# Patient Record
Sex: Female | Born: 1970 | Race: Black or African American | Hispanic: No | Marital: Married | Smoking: Never smoker
Health system: Southern US, Community
[De-identification: ages and names within clinical notes are randomized; demographics above are authoritative.]

## PROBLEM LIST (undated history)

## (undated) DIAGNOSIS — G43909 Migraine, unspecified, not intractable, without status migrainosus: Secondary | ICD-10-CM

## (undated) DIAGNOSIS — F4010 Social phobia, unspecified: Secondary | ICD-10-CM

## (undated) DIAGNOSIS — T7840XA Allergy, unspecified, initial encounter: Secondary | ICD-10-CM

## (undated) DIAGNOSIS — E079 Disorder of thyroid, unspecified: Secondary | ICD-10-CM

## (undated) DIAGNOSIS — Z8619 Personal history of other infectious and parasitic diseases: Secondary | ICD-10-CM

## (undated) DIAGNOSIS — E785 Hyperlipidemia, unspecified: Secondary | ICD-10-CM

## (undated) HISTORY — DX: Social phobia, unspecified: F40.10

## (undated) HISTORY — DX: Personal history of other infectious and parasitic diseases: Z86.19

## (undated) HISTORY — DX: Allergy, unspecified, initial encounter: T78.40XA

## (undated) HISTORY — DX: Hyperlipidemia, unspecified: E78.5

## (undated) HISTORY — DX: Disorder of thyroid, unspecified: E07.9

## (undated) HISTORY — DX: Migraine, unspecified, not intractable, without status migrainosus: G43.909

---

## 2005-07-13 HISTORY — PX: ABDOMINAL HYSTERECTOMY: SHX81

## 2005-09-29 DIAGNOSIS — F4010 Social phobia, unspecified: Secondary | ICD-10-CM | POA: Insufficient documentation

## 2009-07-11 DIAGNOSIS — E785 Hyperlipidemia, unspecified: Secondary | ICD-10-CM | POA: Insufficient documentation

## 2009-08-23 ENCOUNTER — Ambulatory Visit: Payer: Self-pay | Admitting: Family Medicine

## 2012-11-14 ENCOUNTER — Ambulatory Visit: Payer: Self-pay | Admitting: Family Medicine

## 2013-06-14 ENCOUNTER — Ambulatory Visit: Payer: Self-pay | Admitting: Family Medicine

## 2014-04-12 ENCOUNTER — Ambulatory Visit: Payer: Self-pay | Admitting: Surgery

## 2014-04-12 LAB — BASIC METABOLIC PANEL
ANION GAP: 5 — AB (ref 7–16)
BUN: 10 mg/dL (ref 7–18)
CHLORIDE: 107 mmol/L (ref 98–107)
CREATININE: 0.89 mg/dL (ref 0.60–1.30)
Calcium, Total: 8.4 mg/dL — ABNORMAL LOW (ref 8.5–10.1)
Co2: 28 mmol/L (ref 21–32)
Glucose: 77 mg/dL (ref 65–99)
OSMOLALITY: 277 (ref 275–301)
Potassium: 3.9 mmol/L (ref 3.5–5.1)
SODIUM: 140 mmol/L (ref 136–145)

## 2014-04-12 LAB — HEPATIC FUNCTION PANEL A (ARMC)
ALBUMIN: 3.7 g/dL (ref 3.4–5.0)
ALK PHOS: 53 U/L
Bilirubin,Total: 0.2 mg/dL (ref 0.2–1.0)
SGOT(AST): 18 U/L (ref 15–37)
SGPT (ALT): 22 U/L
Total Protein: 7.3 g/dL (ref 6.4–8.2)

## 2014-04-12 LAB — CBC WITH DIFFERENTIAL/PLATELET
Basophil #: 0 10*3/uL (ref 0.0–0.1)
Basophil %: 0.6 %
Eosinophil #: 0.1 10*3/uL (ref 0.0–0.7)
Eosinophil %: 1.3 %
HCT: 40.3 % (ref 35.0–47.0)
HGB: 12.2 g/dL (ref 12.0–16.0)
Lymphocyte #: 2.7 10*3/uL (ref 1.0–3.6)
Lymphocyte %: 34.4 %
MCH: 20.9 pg — ABNORMAL LOW (ref 26.0–34.0)
MCHC: 30.2 g/dL — ABNORMAL LOW (ref 32.0–36.0)
MCV: 69 fL — ABNORMAL LOW (ref 80–100)
Monocyte #: 0.8 x10 3/mm (ref 0.2–0.9)
Monocyte %: 9.5 %
NEUTROS ABS: 4.3 10*3/uL (ref 1.4–6.5)
Neutrophil %: 54.2 %
PLATELETS: 379 10*3/uL (ref 150–440)
RBC: 5.81 10*6/uL — AB (ref 3.80–5.20)
RDW: 15.1 % — AB (ref 11.5–14.5)
WBC: 8 10*3/uL (ref 3.6–11.0)

## 2014-04-12 LAB — APTT: Activated PTT: 36.3 secs — ABNORMAL HIGH (ref 23.6–35.9)

## 2014-04-12 LAB — PROTIME-INR
INR: 1
Prothrombin Time: 13 secs (ref 11.5–14.7)

## 2014-04-20 ENCOUNTER — Ambulatory Visit: Payer: Self-pay | Admitting: Surgery

## 2014-04-20 HISTORY — PX: THYROIDECTOMY: SHX17

## 2014-04-21 LAB — CALCIUM: Calcium, Total: 7.9 mg/dL — ABNORMAL LOW (ref 8.5–10.1)

## 2014-04-21 LAB — ALBUMIN: Albumin: 3.4 g/dL (ref 3.4–5.0)

## 2014-04-27 LAB — PATHOLOGY REPORT

## 2014-05-25 ENCOUNTER — Ambulatory Visit: Payer: Self-pay | Admitting: Family Medicine

## 2014-08-15 LAB — LIPID PANEL
Cholesterol: 216 mg/dL — AB (ref 0–200)
HDL: 48 mg/dL (ref 35–70)
LDL Cholesterol: 131 mg/dL
TRIGLYCERIDES: 187 mg/dL — AB (ref 40–160)

## 2014-08-15 LAB — TSH: TSH: 0.45 u[IU]/mL (ref 0.41–5.90)

## 2014-11-03 NOTE — Op Note (Signed)
PATIENT NAME:  Laura Wilkinson, Corayma L MR#:  [id number] DATE OF BIRTH:  12-16-1970  DATE OF PROCEDURE:  04/20/2014  PREOPERATIVE DIAGNOSIS: Multinodular goiter.   POSTOPERATIVE DIAGNOSIS: Multinodular goiter.   OPERATION PERFORMED: Total thyroidectomy.   SURGEON: Claude MangesWilliam F. Atianna Haidar, MD   FIRST ASSISTANT: Hulda Marinimothy Oaks, MD   ANESTHESIA: General.   PROCEDURE IN DETAIL: The patient was placed supine on the operating room table and prepped and draped in the usual sterile fashion. A curvilinear incision was made in the lines of the neck above the suprasternal notch and this was carried down through the subcutaneous tissue and platysma muscle with the electrocautery. Subplatysmal flaps were created superiorly and inferiorly and the intermediate fascia of the neck was opened in the midline between the strap muscles and the strap muscles were dissected off of the anterior aspects of the thyroid bilaterally. On each side, the thyroid lobe was rotated medially and the middle thyroid vein was ligated and divided and the superior thyroid vessels were ligated and divided with the Harmonic scalpel. On the left, a 2-0 silk ligature was also placed on the superior pole vessels. The inferior pole vessels were individually ligated and divided with the Harmonic scalpel and as the thyroid was rotated medially the recurrent laryngeal nerve was identified and spared harm throughout the procedure by avoiding use of energy devices near the nerve. This was accomplished with medium and small hemoclips and the bipolar electrocautery where necessary. The ligaments of Allyson SabalBerry were divided, and attachments to the trachea were divided, and the pyramidal lobe was dissected off of the anterior aspect of the trachea, and the thyroid was removed. Hemostasis was achieved with a combination of the electrocautery, the bipolar electrocautery, the Harmonic scalpel and medium and small hemoclips. No parathyroid glands were removed during the  procedure, and the recurrent laryngeal nerve on each side was identified and spared. The wound was irrigated with warm normal saline, this was suctioned clear, and a small TLS drain was brought through the suprasternal notch and placed on both sides of the neck, and the intermediate fascia was closed with a running 3-0 Monocryl suture. The platysma was closed with a running 3-0 Monocryl suture and the skin was reapproximated with a running subcuticular 5-0 Monocryl and suture strips. The patient tolerated the procedure well. There were no complications.   ____________________________ Claude MangesWilliam F. Jancarlo Biermann, MD wfm:sb D: 04/20/2014 10:42:32 ET T: 04/20/2014 11:14:48 ET JOB#: [id number]  cc: Claude MangesWilliam F. Clothilde Tippetts, MD, <Dictator> A. Wendall MolaMelissa Solum, MD Claude MangesWILLIAM F Wilene Pharo MD ELECTRONICALLY SIGNED 04/20/2014 18:13

## 2015-10-23 DIAGNOSIS — J309 Allergic rhinitis, unspecified: Secondary | ICD-10-CM | POA: Insufficient documentation

## 2015-10-23 DIAGNOSIS — M549 Dorsalgia, unspecified: Secondary | ICD-10-CM | POA: Insufficient documentation

## 2015-10-23 DIAGNOSIS — R253 Fasciculation: Secondary | ICD-10-CM | POA: Insufficient documentation

## 2015-10-23 DIAGNOSIS — E890 Postprocedural hypothyroidism: Secondary | ICD-10-CM | POA: Insufficient documentation

## 2015-10-23 DIAGNOSIS — R9389 Abnormal findings on diagnostic imaging of other specified body structures: Secondary | ICD-10-CM | POA: Insufficient documentation

## 2015-10-23 DIAGNOSIS — F338 Other recurrent depressive disorders: Secondary | ICD-10-CM | POA: Insufficient documentation

## 2015-10-23 DIAGNOSIS — G43909 Migraine, unspecified, not intractable, without status migrainosus: Secondary | ICD-10-CM | POA: Insufficient documentation

## 2015-10-30 ENCOUNTER — Ambulatory Visit (INDEPENDENT_AMBULATORY_CARE_PROVIDER_SITE_OTHER): Payer: BC Managed Care – PPO | Admitting: Family Medicine

## 2015-10-30 ENCOUNTER — Encounter: Payer: Self-pay | Admitting: Family Medicine

## 2015-10-30 VITALS — BP 122/80 | HR 59 | Temp 98.3°F | Resp 16 | Ht 65.0 in | Wt 221.0 lb

## 2015-10-30 DIAGNOSIS — E890 Postprocedural hypothyroidism: Secondary | ICD-10-CM | POA: Diagnosis not present

## 2015-10-30 DIAGNOSIS — E785 Hyperlipidemia, unspecified: Secondary | ICD-10-CM

## 2015-10-30 DIAGNOSIS — R109 Unspecified abdominal pain: Secondary | ICD-10-CM | POA: Diagnosis not present

## 2015-10-30 DIAGNOSIS — E669 Obesity, unspecified: Secondary | ICD-10-CM

## 2015-10-30 DIAGNOSIS — R12 Heartburn: Secondary | ICD-10-CM

## 2015-10-30 NOTE — Progress Notes (Addendum)
Patient: Laura Wilkinson Female    DOB: [birth year]   44 y.o.   MRN: [id number] Visit Date: 10/30/2015  Today's Provider: Mila Merry, MD   Chief Complaint  Patient presents with  . Follow-up  . Hyperlipidemia  . Hypothyroidism   Subjective:    HPI   Follow-up for hypothyroidism from 08/15/2014; no changes were made.     Lipid/Cholesterol, Follow-up:   Last seen for this 14  months ago.  Management since that visit includes; no changes.  Last Lipid Panel:    Component Value Date/Time   CHOL 216* 08/15/2014   TRIG 187* 08/15/2014   HDL 48 08/15/2014   LDLCALC 131 08/15/2014    She reports good compliance with treatment. She is not having side effects. none  Wt Readings from Last 3 Encounters:  10/30/15 221 lb (100.245 kg)  08/15/14 218 lb (98.884 kg)    ----------------------------------------------------------------------  Abdominal pain for 1 year. Lower abdomin, above pelvic area. Pain is intermittent. No urinary symptoms or change in bowel movements.Pain is intermittent, but occurs a couple of times every night. Often notices when she turns in her bed, sometimes occurs in the morning. No pains during the day and no pain after eating. No bowel changes.   Has been having more heartburn the last few months. Occurs 2- 3 times a week depending on diet.    Allergies  Allergen Reactions  . Phentermine Hcl     Other reaction(s): Headache Blurry Vision   Previous Medications   LEVOTHYROXINE (SYNTHROID, LEVOTHROID) 112 MCG TABLET    Take 1 tablet by mouth daily.   LOVASTATIN (MEVACOR) 10 MG TABLET    Take 1 tablet by mouth daily.   MULTIPLE VITAMINS-MINERALS (MULTIVITAMIN ADULT PO)    Take 1 tablet by mouth daily.   OMEGA-3 FATTY ACIDS PO    Take 2 capsules by mouth daily.    Review of Systems  Constitutional: Negative for fever, chills, appetite change and fatigue.  Respiratory: Negative for chest tightness and shortness of breath.     Cardiovascular: Negative for chest pain and palpitations.  Gastrointestinal: Positive for abdominal distention. Negative for nausea, vomiting and abdominal pain.       Indigestion   Neurological: Negative for dizziness and weakness.    Social History  Substance Use Topics  . Smoking status: Never Smoker   . Smokeless tobacco: Not on file  . Alcohol Use: No   Objective:   BP 122/80 mmHg  Pulse 59  Temp(Src) 98.3 F (36.8 C) (Oral)  Resp 16  Ht  (1.651 m)  Wt 221 lb (100.245 kg)  BMI 36.78 kg/m2  SpO2 98%  Physical Exam  General Appearance:    Alert, cooperative, no distress, obese  Eyes:    PERRL, conjunctiva/corneas clear, EOM's intact       Lungs:     Clear to auscultation bilaterally, respirations unlabored  Heart:    Regular rate and rhythm  Neurologic:   Awake, alert, oriented x 3. No apparent focal neurological           defect.   Abdomen:   Obese, mild tenderness across abdomen below umbilicus. No masses, no rebound, no guarding.        Assessment & Plan:     1. Hypothyroidism, postop  - T4 AND TSH  2. HLD (hyperlipidemia) She is tolerating lovastatin well with no adverse effects.   - Lipid panel  3. Obesity   4. Abdominal pain, unspecified  abdominal location Seems to be more positional, but is occuring fairly frequently  Labs and see how ultrasound looks.  - CBC - Comprehensive metabolic panel - US Abdomen Complete; Future  5. Heartburn Discussed several OTC antacids and acid reducing medications she can take prn, and dietary modifications.        Mila Merryonald Fisher, MD  [facility]

## 2015-10-31 LAB — LIPID PANEL
CHOLESTEROL TOTAL: 240 mg/dL — AB (ref 100–199)
Chol/HDL Ratio: 5 ratio units — ABNORMAL HIGH (ref 0.0–4.4)
HDL: 48 mg/dL (ref >39)
LDL CALC: 155 mg/dL — AB (ref 0–99)
TRIGLYCERIDES: 184 mg/dL — AB (ref 0–149)
VLDL CHOLESTEROL CAL: 37 mg/dL (ref 5–40)

## 2015-10-31 LAB — T4 AND TSH
T4 TOTAL: 9.7 ug/dL (ref 4.5–12.0)
TSH: 10.33 u[IU]/mL — ABNORMAL HIGH (ref 0.450–4.500)

## 2015-10-31 LAB — COMPREHENSIVE METABOLIC PANEL
ALBUMIN: 4.5 g/dL (ref 3.5–5.5)
ALK PHOS: 49 IU/L (ref 39–117)
ALT: 14 IU/L (ref 0–32)
AST: 9 IU/L (ref 0–40)
Albumin/Globulin Ratio: 1.7 (ref 1.2–2.2)
BILIRUBIN TOTAL: 0.3 mg/dL (ref 0.0–1.2)
BUN / CREAT RATIO: 7 — AB (ref 9–23)
BUN: 7 mg/dL (ref 6–24)
CHLORIDE: 101 mmol/L (ref 96–106)
CO2: 21 mmol/L (ref 18–29)
CREATININE: 0.95 mg/dL (ref 0.57–1.00)
Calcium: 9.4 mg/dL (ref 8.7–10.2)
GFR calc Af Amer: 84 mL/min/{1.73_m2} (ref >59)
GFR calc non Af Amer: 73 mL/min/{1.73_m2} (ref >59)
GLUCOSE: 91 mg/dL (ref 65–99)
Globulin, Total: 2.6 g/dL (ref 1.5–4.5)
Potassium: 5.3 mmol/L — ABNORMAL HIGH (ref 3.5–5.2)
Sodium: 141 mmol/L (ref 134–144)
Total Protein: 7.1 g/dL (ref 6.0–8.5)

## 2015-10-31 LAB — CBC
HEMATOCRIT: 40.6 % (ref 34.0–46.6)
Hemoglobin: 13.1 g/dL (ref 11.1–15.9)
MCH: 21.6 pg — AB (ref 26.6–33.0)
MCHC: 32.3 g/dL (ref 31.5–35.7)
MCV: 67 fL — AB (ref 79–97)
PLATELETS: 427 10*3/uL — AB (ref 150–379)
RBC: 6.07 x10E6/uL — ABNORMAL HIGH (ref 3.77–5.28)
RDW: 16.6 % — AB (ref 12.3–15.4)
WBC: 7.6 10*3/uL (ref 3.4–10.8)

## 2015-10-31 NOTE — Addendum Note (Signed)
Addended by: Malva LimesFISHER, Anmarie Fukushima E on: 10/31/2015 08:11 AM   Modules accepted: Kipp BroodSmartSet

## 2015-11-01 ENCOUNTER — Other Ambulatory Visit: Payer: Self-pay | Admitting: Emergency Medicine

## 2015-11-01 MED ORDER — LEVOTHYROXINE SODIUM 125 MCG PO TABS: 125.0000 ug | ORAL_TABLET | Freq: Every day | ORAL | Status: DC

## 2015-11-05 ENCOUNTER — Ambulatory Visit: Payer: BC Managed Care – PPO

## 2015-11-12 ENCOUNTER — Encounter: Payer: Self-pay | Admitting: Family Medicine

## 2015-12-27 ENCOUNTER — Ambulatory Visit
Admission: RE | Admit: 2015-12-27 | Discharge: 2015-12-27 | Disposition: A | Discharge status: Home / Self Care | Payer: BC Managed Care – PPO | Source: Ambulatory Visit | Attending: Family Medicine | Admitting: Family Medicine

## 2015-12-27 DIAGNOSIS — R109 Unspecified abdominal pain: Secondary | ICD-10-CM | POA: Insufficient documentation

## 2016-01-24 ENCOUNTER — Encounter: Payer: Self-pay | Admitting: Family Medicine

## 2016-01-24 ENCOUNTER — Ambulatory Visit (INDEPENDENT_AMBULATORY_CARE_PROVIDER_SITE_OTHER): Payer: BC Managed Care – PPO | Admitting: Family Medicine

## 2016-01-24 VITALS — BP 130/88 | HR 64 | Temp 98.3°F | Resp 16 | Wt 222.0 lb

## 2016-01-24 DIAGNOSIS — E785 Hyperlipidemia, unspecified: Secondary | ICD-10-CM | POA: Diagnosis not present

## 2016-01-24 DIAGNOSIS — E890 Postprocedural hypothyroidism: Secondary | ICD-10-CM | POA: Diagnosis not present

## 2016-01-24 NOTE — Progress Notes (Signed)
Patient: Laura Wilkinson Female    DOB: 09/02/1970   45 y.o.   MRN: [id number] Visit Date: 01/24/2016  Today's Provider: Mila Merryonald Dorn Hartshorne, MD   Chief Complaint  Patient presents with  . Hypothyroidism    follow up   . Hyperlipidemia    follow up   Subjective:    HPI  Lipid/Cholesterol, Follow-up:   Last seen for this 3 months ago.  Management changes since that visit include advising patient top cut back on saturated fats and sugars and exercise 150 minutes weekly. . Last Lipid Panel:    Component Value Date/Time   CHOL 240* 10/30/2015 0842   CHOL 216* 08/15/2014   TRIG 184* 10/30/2015 0842   HDL 48 10/30/2015 0842   HDL 48 08/15/2014   CHOLHDL 5.0* 10/30/2015 0842   LDLCALC 155* 10/30/2015 0842   LDLCALC 131 08/15/2014    Risk factors for vascular disease include hypercholesterolemia  She reports good compliance with treatment. She is not having side effects.  Current symptoms include none and have been stable. Weight trend: fluctuating  Prior visit with dietician: no Current diet: in general, a "healthy" diet   Current exercise: cardiovascular workout on exercise equipment, walking and Insanity workout  Wt Readings from Last 3 Encounters:  10/30/15 221 lb (100.245 kg)  08/15/14 218 lb (98.884 kg)    ------------------------------------------------------------------- Follow up Hypothyroidism: Patient was last seen 3 months ago. Changes made during that visit includes increasing Levothyroxine from 112mcg to 125mcg daily. Patient reports good compliance with treatment and fair tolerance. Since changing the dose of Levothyroxine, patient has had heart palpitations,and fatigue. She also reports 20 minutes after taking the Levothyroxine, she has to have a bowel movement. Is exercising every day and eating more vegetable.s     Allergies  Allergen Reactions  . Phentermine Hcl     Other reaction(s): Headache Blurry Vision   Current Meds  Medication Sig  .  Calcium Carb-Cholecalciferol (CALCIUM 500+D PO) Take 2 tablets by mouth 2 (two) times daily.  Marland Kitchen. levothyroxine (SYNTHROID, LEVOTHROID) 125 MCG tablet Take 1 tablet (125 mcg total) by mouth daily.  Marland Kitchen. lovastatin (MEVACOR) 10 MG tablet Take 1 tablet by mouth daily.  . Multiple Vitamins-Minerals (MULTIVITAMIN ADULT PO) Take 1 tablet by mouth daily.  . OMEGA-3 FATTY ACIDS PO Take 2 capsules by mouth daily.    Review of Systems  Constitutional: Positive for chills (in the evenings before going to bed), fatigue and unexpected weight change (weight fluctuating). Negative for fever and appetite change.  HENT: Positive for ear pain (right ear).   Respiratory: Negative for chest tightness and shortness of breath.   Cardiovascular: Positive for palpitations. Negative for chest pain.  Gastrointestinal: Negative for nausea, vomiting and abdominal pain.  Endocrine: Positive for cold intolerance.  Neurological: Negative for dizziness and weakness.    Social History  Substance Use Topics  . Smoking status: Never Smoker   . Smokeless tobacco: Not on file  . Alcohol Use: No   Objective:   BP 130/88 mmHg  Pulse 64  Temp(Src) 98.3 F (36.8 C) (Oral)  Resp 16  Wt 222 lb (100.699 kg)  Physical Exam  General Appearance:    Alert, cooperative, no distress, obese  Eyes:    PERRL, conjunctiva/corneas clear, EOM's intact       Lungs:     Clear to auscultation bilaterally, respirations unlabored  Heart:    Regular rate and rhythm  Neurologic:   Awake, alert, oriented x 3.  No apparent focal neurological           defect.           Assessment & Plan:     1. HLD (hyperlipidemia) Doing well with diet and exercise and tolerating low dose of lovastatin - Lipid panel  2. Hypothyroidism, postop Some minor bowel changes since increase dose of levothyroxine. See how levels look.  - T4 AND TSH     The entirety of the information documented in the History of Present Illness, Review of Systems and Physical  Exam were personally obtained by me. Portions of this information were initially documented by Awilda Bill, CMA and reviewed by me for thoroughness and accuracy.    Mila Merry, MD  [facility]

## 2016-01-25 LAB — LIPID PANEL
CHOL/HDL RATIO: 4.4 ratio (ref 0.0–4.4)
Cholesterol, Total: 197 mg/dL (ref 100–199)
HDL: 45 mg/dL (ref >39)
LDL Calculated: 124 mg/dL — ABNORMAL HIGH (ref 0–99)
Triglycerides: 140 mg/dL (ref 0–149)
VLDL CHOLESTEROL CAL: 28 mg/dL (ref 5–40)

## 2016-01-25 LAB — T4 AND TSH
T4 TOTAL: 9.4 ug/dL (ref 4.5–12.0)
TSH: 1.33 u[IU]/mL (ref 0.450–4.500)

## 2016-01-27 ENCOUNTER — Telehealth: Payer: Self-pay

## 2016-01-27 NOTE — Telephone Encounter (Signed)
Pt is returning call.  CB#[phone]/MW

## 2016-01-27 NOTE — Telephone Encounter (Signed)
LMTCB 01/27/2016  Thanks,   -Laura  

## 2016-01-27 NOTE — Telephone Encounter (Signed)
Patient advised and verbally voiced understanding.  

## 2016-01-27 NOTE — Telephone Encounter (Signed)
-----   Message from Malva Limesonald E Fisher, MD sent at 01/26/2016  8:53 PM EDT ----- Cholesterol is much better, down from 240 to 197. Thyroid functions are good. Continue current medications.  Check labs yearly.

## 2016-02-07 ENCOUNTER — Encounter: Payer: Self-pay | Admitting: Family Medicine

## 2016-02-12 ENCOUNTER — Encounter: Payer: Self-pay | Admitting: Family Medicine

## 2016-02-19 ENCOUNTER — Other Ambulatory Visit: Payer: Self-pay | Admitting: Family Medicine

## 2016-03-11 ENCOUNTER — Ambulatory Visit
Admission: RE | Admit: 2016-03-11 | Discharge: 2016-03-11 | Disposition: A | Discharge status: Home / Self Care | Payer: BC Managed Care – PPO | Source: Ambulatory Visit | Attending: Otolaryngology | Admitting: Otolaryngology

## 2016-03-11 ENCOUNTER — Other Ambulatory Visit: Payer: Self-pay | Admitting: Otolaryngology

## 2016-03-11 DIAGNOSIS — R51 Headache: Secondary | ICD-10-CM | POA: Diagnosis present

## 2016-03-11 DIAGNOSIS — R519 Headache, unspecified: Secondary | ICD-10-CM

## 2016-03-24 ENCOUNTER — Other Ambulatory Visit: Payer: Self-pay | Admitting: Family Medicine

## 2016-05-05 ENCOUNTER — Other Ambulatory Visit: Payer: Self-pay | Admitting: *Deleted

## 2016-05-05 MED ORDER — LEVOTHYROXINE SODIUM 125 MCG PO TABS: 125.0000 ug | ORAL_TABLET | Freq: Every day | ORAL | 3 refills | Status: DC

## 2016-05-05 NOTE — Telephone Encounter (Signed)
Requesting 90 day supply.

## 2017-02-16 ENCOUNTER — Encounter: Payer: Self-pay | Admitting: Family Medicine

## 2017-02-16 ENCOUNTER — Ambulatory Visit (INDEPENDENT_AMBULATORY_CARE_PROVIDER_SITE_OTHER): Payer: BLUE CROSS/BLUE SHIELD | Admitting: Family Medicine

## 2017-02-16 VITALS — BP 132/80 | HR 80 | Temp 97.8°F | Resp 16 | Ht 65.0 in | Wt 221.0 lb

## 2017-02-16 DIAGNOSIS — E785 Hyperlipidemia, unspecified: Secondary | ICD-10-CM | POA: Diagnosis not present

## 2017-02-16 DIAGNOSIS — Z0000 Encounter for general adult medical examination without abnormal findings: Secondary | ICD-10-CM

## 2017-02-16 DIAGNOSIS — R238 Other skin changes: Secondary | ICD-10-CM | POA: Diagnosis not present

## 2017-02-16 DIAGNOSIS — F339 Major depressive disorder, recurrent, unspecified: Secondary | ICD-10-CM

## 2017-02-16 DIAGNOSIS — Z90710 Acquired absence of both cervix and uterus: Secondary | ICD-10-CM | POA: Diagnosis not present

## 2017-02-16 DIAGNOSIS — R233 Spontaneous ecchymoses: Secondary | ICD-10-CM

## 2017-02-16 DIAGNOSIS — F338 Other recurrent depressive disorders: Secondary | ICD-10-CM

## 2017-02-16 DIAGNOSIS — R252 Cramp and spasm: Secondary | ICD-10-CM | POA: Diagnosis not present

## 2017-02-16 DIAGNOSIS — Z6835 Body mass index (BMI) 35.0-35.9, adult: Secondary | ICD-10-CM | POA: Diagnosis not present

## 2017-02-16 DIAGNOSIS — E890 Postprocedural hypothyroidism: Secondary | ICD-10-CM

## 2017-02-16 NOTE — Progress Notes (Signed)
Patient: Laura Wilkinson, Female    DOB: [birth year], 46 y.o.   MRN: [id number] Visit Date: 02/16/2017  Today's Provider: Mila Merry, MD   Chief Complaint  Patient presents with  . Annual Exam  . Hyperlipidemia  . Hypothyroidism  . muscle cramping  . bruising easily   Subjective:    Annual physical exam Laura Wilkinson is a 46 y.o. female who presents today for health maintenance and complete physical. She feels well. She reports exercising daily. She reports she is sleeping well.  Mammogram- 11/14/2012. Normal. Tdap- 03/11/2012 Pap- 03/11/2012. Normal. Also had a partial hysterectomy in 2007.     Lipid/Cholesterol, Follow-up:   Last seen for this1 years ago.  Management changes since that visit include no changes. . Last Lipid Panel:    Component Value Date/Time   CHOL 197 01/24/2016 0856   TRIG 140 01/24/2016 0856   HDL 45 01/24/2016 0856   CHOLHDL 4.4 01/24/2016 0856   LDLCALC 124 (H) 01/24/2016 0856     She reports good compliance with treatment. She is not having side effects.  Current symptoms include none and have been stable. Weight trend: stable Prior visit with dietician: no Current diet: well balanced Current exercise: running/ jogging and walking  Wt Readings from Last 3 Encounters:  02/16/17 221 lb (100.2 kg)  01/24/16 222 lb (100.7 kg)  10/30/15 221 lb (100.2 kg)     Hypothyroidism, follow up: Patient was last seen in the office 1 year ago. No changes were made in her medications. She is currently taking levothyroxine daily, and reports good symptom control.   Muscle cramps: Patient reports that she is having muscle cramps in her left calf muscle. She denies any injuries. She says that this comes and goes. Her symptoms seem to be worse when she runs or jogs. She also mentions that she stands for long periods of time at work, and this may contribute to her symptoms.   Bruising: Patient reports that she has several unexplained  bruises all throughout her body. She doesn't take an aspirin or any blood thinners. She denies having any injuries.   Review of Systems  Constitutional: Negative.   HENT: Negative.   Eyes: Negative.   Respiratory: Negative.   Cardiovascular: Negative.   Gastrointestinal: Negative.   Endocrine: Negative.   Genitourinary: Negative.   Musculoskeletal: Negative.   Skin: Negative.   Allergic/Immunologic: Positive for environmental allergies.  Neurological: Negative.   Hematological: Bruises/bleeds easily.  Psychiatric/Behavioral: Negative.     Social History      She  reports that she has never smoked. She has never used smokeless tobacco. She reports that she does not drink alcohol or use drugs.       Social History   Social History  . Marital status: Married    Spouse name: N/A  . Number of children: N/A  . Years of education: N/A   Occupational History  .      [employer] associate   Social History Main Topics  . Smoking status: Never Smoker  . Smokeless tobacco: Never Used  . Alcohol use No  . Drug use: No  . Sexual activity: Not Asked   Other Topics Concern  . None   Social History Narrative  . None    Past Medical History:  Diagnosis Date  . Allergy   . History of chicken pox   . Hyperlipidemia   . Migraine   . Social phobia   . Thyroid  disease      Patient Active Problem List   Diagnosis Date Noted  . S/P abdominal hysterectomy 02/16/2017  . Abnormal finding on radiology exam 10/23/2015  . Allergic rhinitis 10/23/2015  . Back pain 10/23/2015  . Fasciculation 10/23/2015  . Migraine headache 10/23/2015  . Hypothyroidism, postop 10/23/2015  . Seasonal affective disorder (HCC) 10/23/2015  . HLD (hyperlipidemia) 07/11/2009  . Social phobia 09/29/2005  . Obesity 07/13/1998    Past Surgical History:  Procedure Laterality Date  . 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 THYROIDECTOMY  04/20/2014    Total. Done by Dr. Anda Kraft    Family History        Family Status  Relation Status  . Mother Alive  . Father Alive  . Sister Alive  . Neg Hx (Not Specified)        Her family history includes Hyperlipidemia in her father and mother; Hypertension in her father and mother.     Allergies  Allergen Reactions  . Phentermine Hcl     Other reaction(s): Headache Blurry Vision     Current Outpatient Prescriptions:  .  Calcium Carb-Cholecalciferol (CALCIUM 500+D PO), Take 2 tablets by mouth 2 (two) times daily., Disp: , Rfl:  .  levothyroxine (SYNTHROID, LEVOTHROID) 125 MCG tablet, Take 1 tablet (125 mcg total) by mouth daily., Disp: 90 tablet, Rfl: 3 .  lovastatin (MEVACOR) 10 MG tablet, TAKE ONE TABLET BY MOUTH ONCE DAILY, Disp: 90 tablet, Rfl: 3 .  Multiple Vitamins-Minerals (MULTIVITAMIN ADULT PO), Take 1 tablet by mouth daily., Disp: , Rfl:  .  OMEGA-3 FATTY ACIDS PO, Take 2 capsules by mouth daily., Disp: , Rfl:    Patient Care Team: Malva Limes, MD as PCP - General (Family Medicine) [facility] (Gynecology)      Objective:   Vitals: BP 132/80 (BP Location: Left Arm, Patient Position: Sitting, Cuff Size: Large)   Pulse 80   Temp 97.8 F (36.6 C)   Resp 16   Ht 5\' 5"  (1.651 m)   Wt 221 lb (100.2 kg)   BMI 36.78 kg/m    Vitals:   02/16/17 0909  BP: 132/80  Pulse: 80  Resp: 16  Temp: 97.8 F (36.6 C)  Weight: 221 lb (100.2 kg)  Height: 5\' 5"  (1.651 m)     Physical Exam  Constitutional: She is oriented to person, place, and time. She appears well-developed and well-nourished.  Overweight  HENT:  Head: Normocephalic and atraumatic.  Right Ear: External ear normal.  Left Ear: External ear normal.  Nose: Nose normal.  Mouth/Throat: Oropharynx is clear and moist.  Eyes: Pupils are equal, round, and reactive to light. Conjunctivae and EOM are normal.  Neck: Normal range of motion. Neck supple.  Cardiovascular: Normal rate, regular rhythm, normal  heart sounds and intact distal pulses.   Pulmonary/Chest: Effort normal and breath sounds normal. Right breast exhibits no inverted nipple, no mass, no nipple discharge, no skin change and no tenderness. Left breast exhibits no inverted nipple, no mass, no nipple discharge, no skin change and no tenderness. Breasts are symmetrical.  Abdominal: Soft. Bowel sounds are normal.  Musculoskeletal: Normal range of motion. She exhibits no edema or tenderness.  Neurological: She is alert and oriented to person, place, and time.  Skin: Skin is warm and dry. No erythema.  Psychiatric: She has a normal mood and affect. Her behavior is normal. Judgment and thought content normal.  Nursing note and vitals reviewed.  Depression Screen PHQ 2/9 Scores 02/16/2017  PHQ - 2 Score 0  PHQ- 9 Score 0      Assessment & Plan:     Routine Health Maintenance and Physical Exam  Exercise Activities and Dietary recommendations Goals    None      Immunization History  Administered Date(s) Administered  . Tdap 03/11/2012    Health Maintenance  Topic Date Due  . HIV Screening  06/21/1985  . INFLUENZA VACCINE  02/10/2017  . TETANUS/TDAP  03/11/2022     Discussed health benefits of physical activity, and encouraged her to engage in regular exercise appropriate for her age and condition.    1. Annual physical exam  - Comprehensive metabolic panel - Lipid panel - T4 AND TSH - CBC  2. Hypothyroidism, postop  - T4 AND TSH  3. Hyperlipidemia, unspecified hyperlipidemia type She is tolerating lovastatin well with no adverse effects.   - Comprehensive metabolic panel - Lipid panel  4. S/P abdominal hysterectomy   5. Seasonal affective disorder (HCC)   6. Abnormal bruising  - CBC - PT and PTT  7. Muscle cramps Try OTC B-complex - Magnesium  8. BMI 35.0-35.9,adult Diet and exercise    Mila Merryonald Cherron Blitzer, MD  [facility]

## 2017-02-16 NOTE — Patient Instructions (Signed)
Try taking OTC B Complex vitamin which often helps with muscle cramps.     Preventive Care 40-64 Years, Female Preventive care refers to lifestyle choices and visits with your health care provider that can promote health and wellness. What does preventive care include?  A yearly physical exam. This is also called an annual well check.  Dental exams once or twice a year.  Routine eye exams. Ask your health care provider how often you should have your eyes checked.  Personal lifestyle choices, including: ? Daily care of your teeth and gums. ? Regular physical activity. ? Eating a healthy diet. ? Avoiding tobacco and drug use. ? Limiting alcohol use. ? Practicing safe sex. ? Taking low-dose aspirin daily starting at age 56. ? Taking vitamin and mineral supplements as recommended by your health care provider. What happens during an annual well check? The services and screenings done by your health care provider during your annual well check will depend on your age, overall health, lifestyle risk factors, and family history of disease. Counseling Your health care provider may ask you questions about your:  Alcohol use.  Tobacco use.  Drug use.  Emotional well-being.  Home and relationship well-being.  Sexual activity.  Eating habits.  Work and work Statistician.  Method of birth control.  Menstrual cycle.  Pregnancy history.  Screening You may have the following tests or measurements:  Height, weight, and BMI.  Blood pressure.  Lipid and cholesterol levels. These may be checked every 5 years, or more frequently if you are over 27 years old.  Skin check.  Lung cancer screening. You may have this screening every year starting at age 35 if you have a 30-pack-year history of smoking and currently smoke or have quit within the past 15 years.  Fecal occult blood test (FOBT) of the stool. You may have this test every year starting at age 56.  Flexible sigmoidoscopy  or colonoscopy. You may have a sigmoidoscopy every 5 years or a colonoscopy every 10 years starting at age 55.  Hepatitis C blood test.  Hepatitis B blood test.  Sexually transmitted disease (STD) testing.  Diabetes screening. This is done by checking your blood sugar (glucose) after you have not eaten for a while (fasting). You may have this done every 1-3 years.  Mammogram. This may be done every 1-2 years. Talk to your health care provider about when you should start having regular mammograms. This may depend on whether you have a family history of breast cancer.  BRCA-related cancer screening. This may be done if you have a family history of breast, ovarian, tubal, or peritoneal cancers.  Pelvic exam and Pap test. This may be done every 3 years starting at age 76. Starting at age 37, this may be done every 5 years if you have a Pap test in combination with an HPV test.  Bone density scan. This is done to screen for osteoporosis. You may have this scan if you are at high risk for osteoporosis.  Discuss your test results, treatment options, and if necessary, the need for more tests with your health care provider. Vaccines Your health care provider may recommend certain vaccines, such as:  Influenza vaccine. This is recommended every year.  Tetanus, diphtheria, and acellular pertussis (Tdap, Td) vaccine. You may need a Td booster every 10 years.  Varicella vaccine. You may need this if you have not been vaccinated.  Zoster vaccine. You may need this after age 71.  Measles, mumps, and rubella (  MMR) vaccine. You may need at least one dose of MMR if you were born in 1957 or later. You may also need a second dose.  Pneumococcal 13-valent conjugate (PCV13) vaccine. You may need this if you have certain conditions and were not previously vaccinated.  Pneumococcal polysaccharide (PPSV23) vaccine. You may need one or two doses if you smoke cigarettes or if you have certain  conditions.  Meningococcal vaccine. You may need this if you have certain conditions.  Hepatitis A vaccine. You may need this if you have certain conditions or if you travel or work in places where you may be exposed to hepatitis A.  Hepatitis B vaccine. You may need this if you have certain conditions or if you travel or work in places where you may be exposed to hepatitis B.  Haemophilus influenzae type b (Hib) vaccine. You may need this if you have certain conditions.  Talk to your health care provider about which screenings and vaccines you need and how often you need them. This information is not intended to replace advice given to you by your health care provider. Make sure you discuss any questions you have with your health care provider. Document Released: 07/26/2015 Document Revised: 03/18/2016 Document Reviewed: 04/30/2015 Elsevier Interactive Patient Education  2017 [employer].

## 2017-02-17 LAB — COMPREHENSIVE METABOLIC PANEL
A/G RATIO: 1.7 (ref 1.2–2.2)
ALBUMIN: 4.3 g/dL (ref 3.5–5.5)
ALT: 16 IU/L (ref 0–32)
AST: 16 IU/L (ref 0–40)
Alkaline Phosphatase: 48 IU/L (ref 39–117)
BILIRUBIN TOTAL: 0.4 mg/dL (ref 0.0–1.2)
BUN / CREAT RATIO: 17 (ref 9–23)
BUN: 11 mg/dL (ref 6–24)
CALCIUM: 9.3 mg/dL (ref 8.7–10.2)
CO2: 21 mmol/L (ref 20–29)
Chloride: 103 mmol/L (ref 96–106)
Creatinine, Ser: 0.64 mg/dL (ref 0.57–1.00)
GFR, EST AFRICAN AMERICAN: 124 mL/min/{1.73_m2} (ref >59)
GFR, EST NON AFRICAN AMERICAN: 107 mL/min/{1.73_m2} (ref >59)
GLOBULIN, TOTAL: 2.6 g/dL (ref 1.5–4.5)
Glucose: 87 mg/dL (ref 65–99)
POTASSIUM: 4.8 mmol/L (ref 3.5–5.2)
SODIUM: 140 mmol/L (ref 134–144)
TOTAL PROTEIN: 6.9 g/dL (ref 6.0–8.5)

## 2017-02-17 LAB — T4 AND TSH
T4 TOTAL: 8.8 ug/dL (ref 4.5–12.0)
TSH: 1.05 u[IU]/mL (ref 0.450–4.500)

## 2017-02-17 LAB — LIPID PANEL
CHOL/HDL RATIO: 3.9 ratio (ref 0.0–4.4)
Cholesterol, Total: 197 mg/dL (ref 100–199)
HDL: 50 mg/dL (ref >39)
LDL Calculated: 122 mg/dL — ABNORMAL HIGH (ref 0–99)
Triglycerides: 126 mg/dL (ref 0–149)
VLDL Cholesterol Cal: 25 mg/dL (ref 5–40)

## 2017-02-17 LAB — CBC
Hematocrit: 43.4 % (ref 34.0–46.6)
Hemoglobin: 13.2 g/dL (ref 11.1–15.9)
MCH: 21.1 pg — AB (ref 26.6–33.0)
MCHC: 30.4 g/dL — ABNORMAL LOW (ref 31.5–35.7)
MCV: 69 fL — ABNORMAL LOW (ref 79–97)
PLATELETS: 386 10*3/uL — AB (ref 150–379)
RBC: 6.26 x10E6/uL — AB (ref 3.77–5.28)
RDW: 16.8 % — ABNORMAL HIGH (ref 12.3–15.4)
WBC: 6.2 10*3/uL (ref 3.4–10.8)

## 2017-02-17 LAB — MAGNESIUM: MAGNESIUM: 2.1 mg/dL (ref 1.6–2.3)

## 2017-02-17 LAB — PT AND PTT
INR: 1 (ref 0.8–1.2)
PROTHROMBIN TIME: 10.3 s (ref 9.1–12.0)
aPTT: 31 s (ref 24–33)

## 2017-03-04 ENCOUNTER — Other Ambulatory Visit: Payer: Self-pay | Admitting: Family Medicine

## 2017-04-29 ENCOUNTER — Other Ambulatory Visit: Payer: Self-pay | Admitting: Family Medicine

## 2017-04-29 DIAGNOSIS — Z1231 Encounter for screening mammogram for malignant neoplasm of breast: Secondary | ICD-10-CM

## 2017-04-30 ENCOUNTER — Ambulatory Visit
Admission: RE | Admit: 2017-04-30 | Discharge: 2017-04-30 | Disposition: A | Discharge status: Home / Self Care | Payer: BLUE CROSS/BLUE SHIELD | Source: Ambulatory Visit | Attending: Family Medicine | Admitting: Family Medicine

## 2017-04-30 DIAGNOSIS — Z1231 Encounter for screening mammogram for malignant neoplasm of breast: Secondary | ICD-10-CM | POA: Diagnosis present

## 2017-05-05 ENCOUNTER — Other Ambulatory Visit: Payer: Self-pay | Admitting: Family Medicine

## 2017-05-05 NOTE — Telephone Encounter (Signed)
Pharmacy requesting refills. Thanks!  

## 2017-06-29 ENCOUNTER — Telehealth: Payer: Self-pay | Admitting: Family Medicine

## 2017-06-29 NOTE — Telephone Encounter (Signed)
Pt needs a copy of her immunization records if we have them on file  Her call back is [phone]  Thanks teri

## 2017-06-30 NOTE — Telephone Encounter (Signed)
LMTCB, we have Tdap in epic but [facility] has 2 accounts with her DOB but different last names, need to find out if she had a different last name before to see if there is another record in [facility] Sonnie [facility], RMA

## 2017-08-19 DIAGNOSIS — H524 Presbyopia: Secondary | ICD-10-CM | POA: Diagnosis not present

## 2018-02-17 ENCOUNTER — Encounter: Payer: Self-pay | Admitting: Family Medicine

## 2018-02-17 ENCOUNTER — Ambulatory Visit (INDEPENDENT_AMBULATORY_CARE_PROVIDER_SITE_OTHER): Payer: 59 | Admitting: Family Medicine

## 2018-02-17 VITALS — BP 143/92 | HR 57 | Temp 98.3°F | Resp 16 | Ht 65.0 in | Wt 220.0 lb

## 2018-02-17 DIAGNOSIS — Z0000 Encounter for general adult medical examination without abnormal findings: Secondary | ICD-10-CM | POA: Diagnosis not present

## 2018-02-17 DIAGNOSIS — E890 Postprocedural hypothyroidism: Secondary | ICD-10-CM | POA: Diagnosis not present

## 2018-02-17 DIAGNOSIS — E785 Hyperlipidemia, unspecified: Secondary | ICD-10-CM

## 2018-02-17 DIAGNOSIS — R718 Other abnormality of red blood cells: Secondary | ICD-10-CM | POA: Diagnosis not present

## 2018-02-17 DIAGNOSIS — J301 Allergic rhinitis due to pollen: Secondary | ICD-10-CM

## 2018-02-17 DIAGNOSIS — R030 Elevated blood-pressure reading, without diagnosis of hypertension: Secondary | ICD-10-CM | POA: Diagnosis not present

## 2018-02-17 NOTE — Patient Instructions (Signed)
You may want to consider getting a hepatitis A vaccine. Hepatitis A is a foodborne virus that occasionally infects the liver and can cause liver failure. Please contact our office if you interested in getting this vaccine   [facility] the [facility] [phone]) to schedule a routine screening mammogram.   DASH Eating Plan DASH stands for "Dietary Approaches to Stop Hypertension." The DASH eating plan is a healthy eating plan that has been shown to reduce high blood pressure (hypertension). It may also reduce your risk for type 2 diabetes, heart disease, and stroke. The DASH eating plan may also help with weight loss. What are tips for following this plan? General guidelines  Avoid eating more than 2,300 mg (milligrams) of salt (sodium) a day. If you have hypertension, you may need to reduce your sodium intake to 1,500 mg a day.  Limit alcohol intake to no more than 1 drink a day for nonpregnant women and 2 drinks a day for men. One drink equals 12 oz of beer, 5 oz of wine, or 1 oz of hard liquor.  Work with your health care provider to maintain a healthy body weight or to lose weight. Ask what an ideal weight is for you.  Get at least 30 minutes of exercise that causes your heart to beat faster (aerobic exercise) most days of the week. Activities may include walking, swimming, or biking.  Work with your health care provider or diet and nutrition specialist (dietitian) to adjust your eating plan to your individual calorie needs. Reading food labels  Check food labels for the amount of sodium per serving. Choose foods with less than 5 percent of the Daily Value of sodium. Generally, foods with less than 300 mg of sodium per serving fit into this eating plan.  To find whole grains, look for the word "whole" as the first word in the ingredient list. Shopping  Buy products labeled as "low-sodium" or "no salt added."  Buy fresh foods. Avoid canned foods and premade or frozen  meals. Cooking  Avoid adding salt when cooking. Use salt-free seasonings or herbs instead of table salt or sea salt. Check with your health care provider or pharmacist before using salt substitutes.  Do not fry foods. Cook foods using healthy methods such as baking, boiling, grilling, and broiling instead.  Cook with heart-healthy oils, such as olive, canola, soybean, or sunflower oil. Meal planning   Eat a balanced diet that includes: ? 5 or more servings of fruits and vegetables each day. At each meal, try to fill half of your plate with fruits and vegetables. ? Up to 6-8 servings of whole grains each day. ? Less than 6 oz of lean meat, poultry, or fish each day. A 3-oz serving of meat is about the same size as a deck of cards. One egg equals 1 oz. ? 2 servings of low-fat dairy each day. ? A serving of nuts, seeds, or beans 5 times each week. ? Heart-healthy fats. Healthy fats called Omega-3 fatty acids are found in foods such as flaxseeds and coldwater fish, like sardines, salmon, and mackerel.  Limit how much you eat of the following: ? Canned or prepackaged foods. ? Food that is high in trans fat, such as fried foods. ? Food that is high in saturated fat, such as fatty meat. ? Sweets, desserts, sugary drinks, and other foods with added sugar. ? Full-fat dairy products.  Do not salt foods before eating.  Try to eat at least  2 vegetarian meals each week.  Eat more home-cooked food and less restaurant, buffet, and fast food.  When eating at a restaurant, ask that your food be prepared with less salt or no salt, if possible. What foods are recommended? The items listed may not be a complete list. Talk with your dietitian about what dietary choices are best for you. Grains Whole-grain or whole-wheat bread. Whole-grain or whole-wheat pasta. Brown rice. [place] Morrow. Bulgur. Whole-grain and low-sodium cereals. Pita bread. Low-fat, low-sodium crackers. Whole-wheat flour  tortillas. Vegetables Fresh or frozen vegetables (raw, steamed, roasted, or grilled). Low-sodium or reduced-sodium tomato and vegetable juice. Low-sodium or reduced-sodium tomato sauce and tomato paste. Low-sodium or reduced-sodium canned vegetables. Fruits All fresh, dried, or frozen fruit. Canned fruit in natural juice (without added sugar). Meat and other protein foods Skinless chicken or [place]. Ground chicken or [place]. Pork with fat trimmed off. Fish and seafood. Egg whites. Dried beans, peas, or lentils. Unsalted nuts, nut butters, and seeds. Unsalted canned beans. Lean cuts of beef with fat trimmed off. Low-sodium, lean deli meat. Dairy Low-fat (1%) or fat-free (skim) milk. Fat-free, low-fat, or reduced-fat cheeses. Nonfat, low-sodium ricotta or cottage cheese. Low-fat or nonfat yogurt. Low-fat, low-sodium cheese. Fats and oils Soft margarine without trans fats. Vegetable oil. Low-fat, reduced-fat, or light mayonnaise and salad dressings (reduced-sodium). Canola, safflower, olive, soybean, and sunflower oils. Avocado. Seasoning and other foods Herbs. Spices. Seasoning mixes without salt. Unsalted popcorn and pretzels. Fat-free sweets. What foods are not recommended? The items listed may not be a complete list. Talk with your dietitian about what dietary choices are best for you. Grains Baked goods made with fat, such as croissants, muffins, or some breads. Dry pasta or rice meal packs. Vegetables Creamed or fried vegetables. Vegetables in a cheese sauce. Regular canned vegetables (not low-sodium or reduced-sodium). Regular canned tomato sauce and paste (not low-sodium or reduced-sodium). Regular tomato and vegetable juice (not low-sodium or reduced-sodium). Angie Fava. Olives. Fruits Canned fruit in a light or heavy syrup. Fried fruit. Fruit in cream or butter sauce. Meat and other protein foods Fatty cuts of meat. Ribs. Fried meat. Berniece Salines. Sausage. Bologna and other processed lunch meats.  Salami. Fatback. Hotdogs. Bratwurst. Salted nuts and seeds. Canned beans with added salt. Canned or smoked fish. Whole eggs or egg yolks. Chicken or [place] with skin. Dairy Whole or 2% milk, cream, and half-and-half. Whole or full-fat cream cheese. Whole-fat or sweetened yogurt. Full-fat cheese. Nondairy creamers. Whipped toppings. Processed cheese and cheese spreads. Fats and oils Butter. Stick margarine. Lard. Shortening. Ghee. Bacon fat. Tropical oils, such as coconut, palm kernel, or palm oil. Seasoning and other foods Salted popcorn and pretzels. Onion salt, garlic salt, seasoned salt, table salt, and sea salt. Worcestershire sauce. Tartar sauce. Barbecue sauce. Teriyaki sauce. Soy sauce, including reduced-sodium. Steak sauce. Canned and packaged gravies. Fish sauce. Oyster sauce. Cocktail sauce. Horseradish that you find on the shelf. Ketchup. Mustard. Meat flavorings and tenderizers. Bouillon cubes. Hot sauce and Tabasco sauce. Premade or packaged marinades. Premade or packaged taco seasonings. Relishes. Regular salad dressings. Where to find more information:  National Heart, Lung, and [place]: https://wilson-eaton.com/  American Heart Association: www.heart.org Summary  The DASH eating plan is a healthy eating plan that has been shown to reduce high blood pressure (hypertension). It may also reduce your risk for type 2 diabetes, heart disease, and stroke.  With the DASH eating plan, you should limit salt (sodium) intake to 2,300 mg a day. If you have hypertension, you may  need to reduce your sodium intake to 1,500 mg a day.  When on the DASH eating plan, aim to eat more fresh fruits and vegetables, whole grains, lean proteins, low-fat dairy, and heart-healthy fats.  Work with your health care provider or diet and nutrition specialist (dietitian) to adjust your eating plan to your individual calorie needs. This information is not intended to replace advice given to you by your health  care provider. Make sure you discuss any questions you have with your health care provider. Document Released: 06/18/2011 Document Revised: 06/22/2016 Document Reviewed: 06/22/2016 Elsevier Interactive Patient Education  [employer].

## 2018-02-17 NOTE — Progress Notes (Signed)
Patient: Laura Wilkinson, Female    DOB: 1971/06/23, 47 y.o.   MRN: [id number] Visit Date: 02/17/2018  Today's Provider: Mila Merry, MD   No chief complaint on file.  Subjective:    Annual physical exam Laura Wilkinson is a 47 y.o. female who presents today for health maintenance and complete physical. She feels well. She reports exercising most days, usually walks about two miles. . She reports she is sleeping fairly well.  --------------------------------------------------------------    Lipid/Cholesterol, Follow-up:   Last seen for this 1 years ago.  Management since that visit includes; labs checked, no changes.  Last Lipid Panel:    Component Value Date/Time   CHOL 197 02/16/2017 1003   TRIG 126 02/16/2017 1003   HDL 50 02/16/2017 1003   CHOLHDL 3.9 02/16/2017 1003   LDLCALC 122 (H) 02/16/2017 1003    She reports good compliance with treatment. She is not having side effects.   Wt Readings from Last 3 Encounters:  02/16/17 221 lb (100.2 kg)  01/24/16 222 lb (100.7 kg)  10/30/15 221 lb (100.2 kg)    ---------------------------------------------------------------   Hypothyroidism, postop From 02/16/2017-labs checked, no changes. Lab Results  Component Value Date   TSH 1.050 02/16/2017     Iron Deficiency From 02/16/2017-labs checked. Recommended starting daily Iron supplement 325 mg due to low MCV and MCH No results found for: FERRITIN Lab Results  Component Value Date   WBC 6.2 02/16/2017   HGB 13.2 02/16/2017   HCT 43.4 02/16/2017   MCV 69 (L) 02/16/2017   PLT 386 (H) 02/16/2017     Review of Systems  Constitutional: Positive for diaphoresis.  HENT: Positive for congestion and sinus pressure.   Endocrine: Positive for heat intolerance.  Musculoskeletal: Positive for neck pain.  Allergic/Immunologic: Positive for environmental allergies.  All other systems reviewed and are negative.   Social History      She  reports that she has  never smoked. She has never used smokeless tobacco. She reports that she does not drink alcohol or use drugs.       Social History   Socioeconomic History  . Marital status: Married    Spouse name: Not on file  . Number of children: Not on file  . Years of education: Not on file  . Highest education level: Not on file  Occupational History    Comment: Chiropodist associate  Social Needs  . Financial resource strain: Not on file  . Food insecurity:    Worry: Not on file    Inability: Not on file  . Transportation needs:    Medical: Not on file    Non-medical: Not on file  Tobacco Use  . Smoking status: Never Smoker  . Smokeless tobacco: Never Used  Substance and Sexual Activity  . Alcohol use: No    Alcohol/week: 0.0 standard drinks  . Drug use: No  . Sexual activity: Not on file  Lifestyle  . Physical activity:    Days per week: Not on file    Minutes per session: Not on file  . Stress: Not on file  Relationships  . Social connections:    Talks on phone: Not on file    Gets together: Not on file    Attends religious service: Not on file    Active member of club or organization: Not on file    Attends meetings of clubs or organizations: Not on file    Relationship status: Not on file  Other Topics Concern  . Not on file  Social History Narrative  . Not on file    Past Medical History:  Diagnosis Date  . Allergy   . History of chicken pox   . Hyperlipidemia   . Migraine   . Social phobia   . Thyroid disease      Patient Active Problem List   Diagnosis Date Noted  . S/P abdominal hysterectomy 02/16/2017  . Abnormal finding on radiology exam 10/23/2015  . Allergic rhinitis 10/23/2015  . Back pain 10/23/2015  . Fasciculation 10/23/2015  . Migraine headache 10/23/2015  . Hypothyroidism, postop 10/23/2015  . Seasonal affective disorder (HCC) 10/23/2015  . HLD (hyperlipidemia) 07/11/2009  . Social phobia 09/29/2005  . Obesity 07/13/1998     Past Surgical History:  Procedure Laterality Date  . 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 THYROIDECTOMY  04/20/2014   Total. Done by Dr. Anda KraftMarterre    Family History        Family Status  Relation Name Status  . Mother  Alive  . Father  Alive  . Sister  Alive  . Neg Hx  (Not Specified)        Her family history includes Hyperlipidemia in her father and mother; Hypertension in her father and mother. There is no history of Cancer, Diabetes, Heart attack, or Breast cancer.      Allergies  Allergen Reactions  . Phentermine Hcl     Other reaction(s): Headache Blurry Vision     Current Outpatient Medications:  .  Calcium Carb-Cholecalciferol (CALCIUM 500+D PO), Take 2 tablets by mouth 2 (two) times daily., Disp: , Rfl:  .  levothyroxine (SYNTHROID, LEVOTHROID) 125 MCG tablet, TAKE ONE TABLET BY MOUTH ONCE DAILY, Disp: 90 tablet, Rfl: 3 .  lovastatin (MEVACOR) 10 MG tablet, TAKE ONE TABLET BY MOUTH ONCE DAILY, Disp: 30 tablet, Rfl: 11 .  Multiple Vitamins-Minerals (MULTIVITAMIN ADULT PO), Take 1 tablet by mouth daily., Disp: , Rfl:  .  OMEGA-3 FATTY ACIDS PO, Take 2 capsules by mouth daily., Disp: , Rfl:    Patient Care Team: Malva LimesFisher, Donald E, MD as PCP - General (Family Medicine) [facility]      Objective:   Vitals: BP (!) 143/92 (BP Location: Right Arm, Patient Position: Sitting, Cuff Size: Large)   Pulse (!) 57   Temp 98.3 F (36.8 C) (Oral)   Resp 16   Ht 5\' 5"  (1.651 m)   Wt 220 lb (99.8 kg)   SpO2 97%   BMI 36.61 kg/m    Vitals:   02/17/18 0910  BP: (!) 143/92  Pulse: (!) 57  Resp: 16  Temp: 98.3 F (36.8 C)  TempSrc: Oral  SpO2: 97%  Weight: 220 lb (99.8 kg)  Height: 5\' 5"  (1.651 m)     Physical Exam   General Appearance:    Alert, cooperative, no distress, appears stated age  Head:    Normocephalic, without obvious abnormality, atraumatic  Eyes:    PERRL, conjunctiva/corneas clear,  EOM's intact, fundi    benign, both eyes  Ears:    Normal TM's and external ear canals, both ears  Nose:   Nares normal, septum midline, mucosa normal, no drainage    or sinus tenderness  Throat:   Lips, mucosa, and tongue normal; teeth and gums normal  Neck:   Supple, symmetrical, trachea midline, no adenopathy;    thyroid:  no enlargement/tenderness/nodules; no carotid   bruit or JVD  Back:     Symmetric, no curvature, ROM normal, no CVA tenderness  Lungs:     Clear to auscultation bilaterally, respirations unlabored  Chest Wall:    No tenderness or deformity   Heart:    Regular rate and rhythm, S1 and S2 normal, no murmur, rub   or gallop  Breast Exam:    normal appearance, no masses or tenderness  Abdomen:     Soft, non-tender, bowel sounds active all four quadrants,    no masses, no organomegaly  Pelvic:    deferred and not indicated; status post hysterectomy, negative ROS  Extremities:   Extremities normal, atraumatic, no cyanosis or edema  Pulses:   2+ and symmetric all extremities  Skin:   Skin color, texture, turgor normal, no rashes or lesions  Lymph nodes:   Cervical, supraclavicular, and axillary nodes normal  Neurologic:   CNII-XII intact, normal strength, sensation and reflexes    throughout    Depression Screen PHQ 2/9 Scores 02/16/2017  PHQ - 2 Score 0  PHQ- 9 Score 0      Assessment & Plan:     Routine Health Maintenance and Physical Exam  Exercise Activities and Dietary recommendations Goals   None     Immunization History  Administered Date(s) Administered  . Tdap 03/11/2012    Health Maintenance  Topic Date Due  . HIV Screening  06/21/1985  . INFLUENZA VACCINE  02/10/2018  . TETANUS/TDAP  03/11/2022     Discussed health benefits of physical activity, and encouraged her to engage in regular exercise appropriate for her age and condition.    --------------------------------------------------------------------  1. Annual physical  exam Generally doing well. Somewhat obesity, encourage walking every day and reduce caloric intake. Discussed availability of Hep A vaccine.   2. Hypothyroidism, postop Feels well on current thyroid replacement.  - TSH  3. Hyperlipidemia, unspecified hyperlipidemia type She is tolerating lovastatin well with no adverse effects.   - Comprehensive metabolic panel - Lipid panel  4. Single episode of elevated blood pressure Encourage avoidance of sodium in diet. Check at home periodically.   5. Allergic rhinitis due to pollen, unspecified seasonality Recommend she try intermittent OTC nasal steroid such as Flonase   Mila Merry, MD  [facility]

## 2018-02-18 LAB — COMPREHENSIVE METABOLIC PANEL WITH GFR
ALT: 11 IU/L (ref 0–32)
AST: 11 IU/L (ref 0–40)
Albumin/Globulin Ratio: 2 (ref 1.2–2.2)
Albumin: 4.8 g/dL (ref 3.5–5.5)
Alkaline Phosphatase: 47 IU/L (ref 39–117)
BUN/Creatinine Ratio: 16 (ref 9–23)
BUN: 15 mg/dL (ref 6–24)
Bilirubin Total: 0.4 mg/dL (ref 0.0–1.2)
CO2: 22 mmol/L (ref 20–29)
Calcium: 9.2 mg/dL (ref 8.7–10.2)
Chloride: 103 mmol/L (ref 96–106)
Creatinine, Ser: 0.94 mg/dL (ref 0.57–1.00)
GFR calc Af Amer: 84 mL/min/1.73 (ref >59)
GFR calc non Af Amer: 72 mL/min/1.73 (ref >59)
Globulin, Total: 2.4 g/dL (ref 1.5–4.5)
Glucose: 88 mg/dL (ref 65–99)
Potassium: 4.6 mmol/L (ref 3.5–5.2)
Sodium: 141 mmol/L (ref 134–144)
Total Protein: 7.2 g/dL (ref 6.0–8.5)

## 2018-02-18 LAB — LIPID PANEL
CHOL/HDL RATIO: 4.2 ratio (ref 0.0–4.4)
Cholesterol, Total: 200 mg/dL — ABNORMAL HIGH (ref 100–199)
HDL: 48 mg/dL (ref >39)
LDL Calculated: 129 mg/dL — ABNORMAL HIGH (ref 0–99)
TRIGLYCERIDES: 117 mg/dL (ref 0–149)
VLDL Cholesterol Cal: 23 mg/dL (ref 5–40)

## 2018-02-18 LAB — TSH: TSH: 0.466 u[IU]/mL (ref 0.450–4.500)

## 2018-03-22 ENCOUNTER — Other Ambulatory Visit: Payer: Self-pay | Admitting: Family Medicine

## 2018-03-22 DIAGNOSIS — Z1231 Encounter for screening mammogram for malignant neoplasm of breast: Secondary | ICD-10-CM

## 2018-04-13 ENCOUNTER — Other Ambulatory Visit: Payer: Self-pay | Admitting: Family Medicine

## 2018-04-15 ENCOUNTER — Telehealth: Payer: 59 | Admitting: Family

## 2018-04-15 DIAGNOSIS — B9689 Other specified bacterial agents as the cause of diseases classified elsewhere: Secondary | ICD-10-CM | POA: Diagnosis not present

## 2018-04-15 DIAGNOSIS — J028 Acute pharyngitis due to other specified organisms: Secondary | ICD-10-CM | POA: Diagnosis not present

## 2018-04-15 MED ORDER — PREDNISONE 5 MG PO TABS: 5.0000 mg | ORAL_TABLET | ORAL | 0 refills | Status: DC

## 2018-04-15 MED ORDER — AZITHROMYCIN 250 MG PO TABS: ORAL_TABLET | ORAL | 0 refills | Status: DC

## 2018-04-15 MED ORDER — BENZONATATE 100 MG PO CAPS: 100.0000 mg | ORAL_CAPSULE | Freq: Three times a day (TID) | ORAL | 0 refills | Status: DC | PRN

## 2018-04-15 NOTE — Progress Notes (Signed)

## 2018-05-02 ENCOUNTER — Ambulatory Visit: Payer: BLUE CROSS/BLUE SHIELD

## 2018-05-02 ENCOUNTER — Ambulatory Visit
Admission: RE | Admit: 2018-05-02 | Discharge: 2018-05-02 | Disposition: A | Discharge status: Home / Self Care | Payer: 59 | Source: Ambulatory Visit | Attending: Family Medicine | Admitting: Family Medicine

## 2018-05-02 DIAGNOSIS — Z1231 Encounter for screening mammogram for malignant neoplasm of breast: Secondary | ICD-10-CM | POA: Diagnosis not present

## 2018-06-03 ENCOUNTER — Encounter: Payer: Self-pay | Admitting: Family Medicine

## 2018-06-13 ENCOUNTER — Other Ambulatory Visit: Payer: Self-pay | Admitting: Family Medicine

## 2018-08-25 ENCOUNTER — Encounter: Payer: Self-pay | Admitting: Family Medicine

## 2018-08-25 ENCOUNTER — Ambulatory Visit: Payer: 59 | Admitting: Family Medicine

## 2018-08-25 VITALS — BP 128/80 | HR 72 | Temp 98.5°F | Resp 16 | Wt 228.0 lb

## 2018-08-25 DIAGNOSIS — R319 Hematuria, unspecified: Secondary | ICD-10-CM

## 2018-08-25 LAB — POCT URINALYSIS DIPSTICK
Bilirubin, UA: NEGATIVE
Glucose, UA: NEGATIVE
Ketones, UA: NEGATIVE
LEUKOCYTES UA: NEGATIVE
Nitrite, UA: NEGATIVE
PROTEIN UA: NEGATIVE
SPEC GRAV UA: 1.015 (ref 1.010–1.025)
Urobilinogen, UA: 0.2 E.U./dL
pH, UA: 5 (ref 5.0–8.0)

## 2018-08-25 NOTE — Progress Notes (Signed)
Patient: Laura Wilkinson Female    DOB: [birth year]   48 y.o.   MRN: [id number] Visit Date: 08/25/2018  Today's Provider: Mila Merryonald Soha Thorup, MD   Chief Complaint  Patient presents with  . Hematuria   Subjective:     Hematuria  This is a new problem. Episode onset: 3 days ago. The problem has been resolved since onset. She describes the hematuria as gross hematuria. Her pain is at a severity of 0/10. She is experiencing no pain. Irritative symptoms do not include frequency or urgency. Pertinent negatives include no abdominal pain, chills, dysuria, facial swelling, fever, flank pain, genital pain, hesitancy, inability to urinate, nausea or vomiting.  States she had one episode 3 days ago that it looked like a drop of blood in toilet water after urinating. No pain, no dysuria, no frequency, no abdominal or pelvic discomfort.   Allergies  Allergen Reactions  . Phentermine Hcl     Other reaction(s): Headache Blurry Vision     Current Outpatient Medications:  .  Calcium Carb-Cholecalciferol (CALCIUM 500+D PO), Take 2 tablets by mouth 2 (two) times daily., Disp: , Rfl:  .  glucosamine-chondroitin 500-400 MG tablet, Take 1 tablet by mouth 3 (three) times daily., Disp: , Rfl:  .  levothyroxine (SYNTHROID, LEVOTHROID) 125 MCG tablet, TAKE 1 TABLET BY MOUTH ONCE DAILY, Disp: 30 tablet, Rfl: 11 .  lovastatin (MEVACOR) 10 MG tablet, TAKE 1 TABLET BY MOUTH ONCE DAILY, Disp: 30 tablet, Rfl: 11 .  Multiple Vitamins-Minerals (MULTIVITAMIN ADULT PO), Take 1 tablet by mouth daily., Disp: , Rfl:  .  OMEGA-3 FATTY ACIDS PO, Take 2 capsules by mouth daily., Disp: , Rfl:  .  azithromycin (ZITHROMAX) 250 MG tablet, Take 2 tabs now then 1 daily times 4 days, Disp: 6 tablet, Rfl: 0 .  benzonatate (TESSALON PERLES) 100 MG capsule, Take 1-2 capsules (100-200 mg total) by mouth every 8 (eight) hours as needed for cough., Disp: 30 capsule, Rfl: 0 .  predniSONE (DELTASONE) 5 MG tablet, Take 1 tablet (5 mg  total) by mouth as directed. Taper 6,5,4,3,2,1, Disp: 21 tablet, Rfl: 0  Review of Systems  Constitutional: Negative.  Negative for chills and fever.  HENT: Negative for facial swelling.   Gastrointestinal: Negative.  Negative for abdominal pain, nausea and vomiting.  Genitourinary: Positive for hematuria. Negative for decreased urine volume, difficulty urinating, dyspareunia, dysuria, enuresis, flank pain, frequency, genital sores, hesitancy, menstrual problem, pelvic pain, urgency, vaginal bleeding, vaginal discharge and vaginal pain.  Musculoskeletal: Positive for back pain.  Neurological: Negative for dizziness, light-headedness and headaches.    Social History   Tobacco Use  . Smoking status: Never Smoker  . Smokeless tobacco: Never Used  Substance Use Topics  . Alcohol use: No    Alcohol/week: 0.0 standard drinks      Objective:   BP 128/80 (BP Location: Right Arm, Patient Position: Sitting, Cuff Size: Large)   Pulse 72   Temp 98.5 F (36.9 C) (Oral)   Resp 16   Wt 228 lb (103.4 kg)   BMI 37.94 kg/m  Vitals:   08/25/18 0816  BP: 128/80  Pulse: 72  Resp: 16  Temp: 98.5 F (36.9 C)  TempSrc: Oral  Weight: 228 lb (103.4 kg)    Physical Exam  General appearance: alert, well developed, well nourished, cooperative and in no distress Head: Normocephalic, without obvious abnormality, atraumatic Respiratory: Respirations even and unlabored, normal respiratory rate Extremities: No gross deformities Skin: Skin color, texture, turgor normal.  No rashes seen  Psych: Appropriate mood and affect. Neurologic: Mental status: Alert, oriented to person, place, and time, thought content appropriate. Results for orders placed or performed in visit on 08/25/18  POCT urinalysis dipstick  Result Value Ref Range   Color, UA     Clarity, UA     Glucose, UA Negative Negative   Bilirubin, UA Negative    Ketones, UA Negative    Spec Grav, UA 1.015 1.010 - 1.025   Blood, UA Trace      pH, UA 5.0 5.0 - 8.0   Protein, UA Negative Negative   Urobilinogen, UA 0.2 0.2 or 1.0 E.U./dL   Nitrite, UA Negative    Leukocytes, UA Negative Negative   Appearance     Odor         Assessment & Plan    1. Hematuria, unspecified type  - CULTURE, URINE COMPREHENSIVE  A single episode with no other symptoms. If occurs again will refer to urology for further evaluation.     Mila Merryonald Zury Fazzino, MD  [facility]

## 2018-08-25 NOTE — Patient Instructions (Signed)
.   Please review the attached list of medications and notify my office if there are any errors.   . Please bring all of your medications to every appointment so we can make sure that our medication list is the same as yours.   

## 2018-08-28 LAB — CULTURE, URINE COMPREHENSIVE

## 2018-08-29 ENCOUNTER — Telehealth: Payer: Self-pay

## 2018-08-29 DIAGNOSIS — N390 Urinary tract infection, site not specified: Secondary | ICD-10-CM

## 2018-08-29 NOTE — Telephone Encounter (Signed)
Tried calling patient. Left message to call back. 

## 2018-08-29 NOTE — Telephone Encounter (Signed)
-----   Message from Malva Limes, MD sent at 08/28/2018  7:42 PM EST ----- Urine culture shows mild infection. Recommend septra DS BID x 3 days.

## 2018-08-30 MED ORDER — SULFAMETHOXAZOLE-TRIMETHOPRIM 800-160 MG PO TABS: 1.0000 | ORAL_TABLET | Freq: Two times a day (BID) | ORAL | 0 refills | Status: DC

## 2018-08-30 NOTE — Telephone Encounter (Signed)
Tried calling patient. Left message to call back. 

## 2018-08-30 NOTE — Telephone Encounter (Signed)
Patient advised and prescription sent into pharmacy.

## 2018-08-30 NOTE — Telephone Encounter (Signed)
Pt returned call ° °Thanks  teri °

## 2019-05-10 ENCOUNTER — Other Ambulatory Visit: Payer: Self-pay | Admitting: Family Medicine

## 2019-06-16 ENCOUNTER — Ambulatory Visit: Payer: Self-pay | Admitting: Family Medicine

## 2019-06-20 ENCOUNTER — Ambulatory Visit: Payer: Self-pay | Admitting: Family Medicine

## 2019-07-26 ENCOUNTER — Ambulatory Visit: Payer: Self-pay | Admitting: Family Medicine

## 2019-08-05 ENCOUNTER — Encounter: Payer: Self-pay | Admitting: Family Medicine

## 2019-08-07 ENCOUNTER — Other Ambulatory Visit: Payer: Self-pay

## 2019-08-07 ENCOUNTER — Other Ambulatory Visit (HOSPITAL_COMMUNITY)
Admission: RE | Admit: 2019-08-07 | Discharge: 2019-08-07 | Disposition: A | Discharge status: Home / Self Care | Payer: Commercial Managed Care - PPO | Source: Ambulatory Visit | Attending: Family Medicine | Admitting: Family Medicine

## 2019-08-07 ENCOUNTER — Encounter: Payer: Self-pay | Admitting: Family Medicine

## 2019-08-07 ENCOUNTER — Ambulatory Visit (INDEPENDENT_AMBULATORY_CARE_PROVIDER_SITE_OTHER): Payer: Self-pay | Admitting: Family Medicine

## 2019-08-07 VITALS — BP 159/92 | HR 71 | Temp 96.8°F | Wt 226.8 lb

## 2019-08-07 DIAGNOSIS — R61 Generalized hyperhidrosis: Secondary | ICD-10-CM | POA: Diagnosis not present

## 2019-08-07 DIAGNOSIS — E890 Postprocedural hypothyroidism: Secondary | ICD-10-CM | POA: Diagnosis not present

## 2019-08-07 DIAGNOSIS — N898 Other specified noninflammatory disorders of vagina: Secondary | ICD-10-CM | POA: Insufficient documentation

## 2019-08-07 DIAGNOSIS — E785 Hyperlipidemia, unspecified: Secondary | ICD-10-CM | POA: Diagnosis not present

## 2019-08-07 DIAGNOSIS — R030 Elevated blood-pressure reading, without diagnosis of hypertension: Secondary | ICD-10-CM

## 2019-08-07 LAB — POCT URINALYSIS DIPSTICK
Bilirubin, UA: NEGATIVE
Glucose, UA: NEGATIVE
Ketones, UA: NEGATIVE
Leukocytes, UA: NEGATIVE
Nitrite, UA: NEGATIVE
Protein, UA: NEGATIVE
Spec Grav, UA: >=1.03 — AB (ref 1.010–1.025)
Urobilinogen, UA: 0.2 E.U./dL
pH, UA: 5 (ref 5.0–8.0)

## 2019-08-07 NOTE — Progress Notes (Signed)
Patient: Laura Wilkinson Female    DOB: 02-Jul-1971   49 y.o.   MRN: [id number] Visit Date: 08/07/2019  Today's Provider: Lelon Huh, MD   Chief Complaint  Patient presents with  . Vaginal Odor  . Night Sweats   Subjective:     HPI Patient presents today for vaginal odor and for about 2 weeks. Patient states that she has back pain as well but no abdominal pain. Patient denies any discharge at the moment.  Patient as well like to discuss having night sweats for about 8-9 years now. Patient reports that the night sweats is worse now. Had partial hyst 2007, never on ERT. No coughing, no weight changes. No fevers or chills.   She is also due for follow up lipids and blood pressure. Is doing well with lovastatin and levothyroxine with no adverse effects.   Allergies  Allergen Reactions  . Phentermine Hcl     Other reaction(s): Headache Blurry Vision     Current Outpatient Medications:  .  Calcium Carb-Cholecalciferol (CALCIUM 500+D PO), Take 2 tablets by mouth 2 (two) times daily., Disp: , Rfl:  .  glucosamine-chondroitin 500-400 MG tablet, Take 1 tablet by mouth 3 (three) times daily., Disp: , Rfl:  .  levothyroxine (SYNTHROID, LEVOTHROID) 125 MCG tablet, TAKE 1 TABLET BY MOUTH ONCE DAILY, Disp: 30 tablet, Rfl: 11 .  lovastatin (MEVACOR) 10 MG tablet, Take 1 tablet by mouth once daily, Disp: 30 tablet, Rfl: 0 .  Multiple Vitamins-Minerals (MULTIVITAMIN ADULT PO), Take 1 tablet by mouth daily., Disp: , Rfl:  .  OMEGA-3 FATTY ACIDS PO, Take 2 capsules by mouth daily., Disp: , Rfl:   Review of Systems  Constitutional: Negative.   Gastrointestinal: Negative for abdominal pain.  Genitourinary: Negative for difficulty urinating, vaginal bleeding, vaginal discharge and vaginal pain.  Musculoskeletal: Positive for back pain.    Social History   Tobacco Use  . Smoking status: Never Smoker  . Smokeless tobacco: Never Used  Substance Use Topics  . Alcohol use: No     Alcohol/week: 0.0 standard drinks      Objective:   BP (!) 159/92 (BP Location: Right Arm, Patient Position: Sitting, Cuff Size: Large)   Pulse 71   Temp (!) 96.8 F (36 C) (Temporal)   Wt 226 lb 12.8 oz (102.9 kg)   BMI 37.74 kg/m  Vitals:   08/07/19 1020  BP: (!) 159/92  Pulse: 71  Temp: (!) 96.8 F (36 C)  TempSrc: Temporal  Weight: 226 lb 12.8 oz (102.9 kg)  Body mass index is 37.74 kg/m.   Physical Exam   General Appearance:    Obese female in no acute distress  Eyes:    PERRL, conjunctiva/corneas clear, EOM's intact       Lungs:     Clear to auscultation bilaterally, respirations unlabored  Heart:    Normal heart rate. Normal rhythm. No murmurs, rubs, or gallops.   MS:   All extremities are intact.   Neurologic:   Awake, alert, oriented x 3. No apparent focal neurological           defect.        Results for orders placed or performed in visit on 08/07/19  POCT urinalysis dipstick  Result Value Ref Range   Color, UA Yellow    Clarity, UA Clear    Glucose, UA Negative Negative   Bilirubin, UA Negative    Ketones, UA Negative    Spec Grav, UA >=1.030 (A)  1.010 - 1.025   Blood, UA Trace    pH, UA 5.0 5.0 - 8.0   Protein, UA Negative Negative   Urobilinogen, UA 0.2 0.2 or 1.0 E.U./dL   Nitrite, UA Negative    Leukocytes, UA Negative Negative   Appearance     Odor        Assessment & Plan    1. Vaginal odor Consider metronidazole - Urine cytology ancillary only  2. Diaphoresis Likely menopausal sx. Is s/p hysterectomy. Consider ERT - CBC with Differential/Platelet - FSH/LH  3. Hyperlipidemia, unspecified hyperlipidemia type She is tolerating lovastatin well with no adverse effects.   - Comprehensive metabolic panel - Lipid panel  4. Hypothyroidism, postop check - TSH - T4, free  5. Elevated blood pressure reading Monitor for now. Work on losing weight.      Mila Merry, MD  [facility]

## 2019-08-08 ENCOUNTER — Encounter: Payer: Self-pay | Admitting: Family Medicine

## 2019-08-08 ENCOUNTER — Telehealth: Payer: Self-pay

## 2019-08-08 DIAGNOSIS — Z780 Asymptomatic menopausal state: Secondary | ICD-10-CM | POA: Insufficient documentation

## 2019-08-08 LAB — COMPREHENSIVE METABOLIC PANEL
ALT: 16 IU/L (ref 0–32)
AST: 16 IU/L (ref 0–40)
Albumin/Globulin Ratio: 1.8 (ref 1.2–2.2)
Albumin: 4.4 g/dL (ref 3.8–4.8)
Alkaline Phosphatase: 60 IU/L (ref 39–117)
BUN/Creatinine Ratio: 16 (ref 9–23)
BUN: 13 mg/dL (ref 6–24)
Bilirubin Total: 0.3 mg/dL (ref 0.0–1.2)
CO2: 22 mmol/L (ref 20–29)
Calcium: 8.8 mg/dL (ref 8.7–10.2)
Chloride: 102 mmol/L (ref 96–106)
Creatinine, Ser: 0.83 mg/dL (ref 0.57–1.00)
GFR calc Af Amer: 96 mL/min/{1.73_m2} (ref >59)
GFR calc non Af Amer: 83 mL/min/{1.73_m2} (ref >59)
Globulin, Total: 2.4 g/dL (ref 1.5–4.5)
Glucose: 78 mg/dL (ref 65–99)
Potassium: 4.2 mmol/L (ref 3.5–5.2)
Sodium: 142 mmol/L (ref 134–144)
Total Protein: 6.8 g/dL (ref 6.0–8.5)

## 2019-08-08 LAB — LIPID PANEL
Chol/HDL Ratio: 4.3 ratio (ref 0.0–4.4)
Cholesterol, Total: 211 mg/dL — ABNORMAL HIGH (ref 100–199)
HDL: 49 mg/dL (ref >39)
LDL Chol Calc (NIH): 127 mg/dL — ABNORMAL HIGH (ref 0–99)
Triglycerides: 196 mg/dL — ABNORMAL HIGH (ref 0–149)
VLDL Cholesterol Cal: 35 mg/dL (ref 5–40)

## 2019-08-08 LAB — CBC WITH DIFFERENTIAL/PLATELET
Basophils Absolute: 0.1 10*3/uL (ref 0.0–0.2)
Basos: 1 %
EOS (ABSOLUTE): 0.1 10*3/uL (ref 0.0–0.4)
Eos: 2 %
Hematocrit: 38.8 % (ref 34.0–46.6)
Hemoglobin: 12.2 g/dL (ref 11.1–15.9)
Immature Grans (Abs): 0 10*3/uL (ref 0.0–0.1)
Immature Granulocytes: 0 %
Lymphocytes Absolute: 1.9 10*3/uL (ref 0.7–3.1)
Lymphs: 42 %
MCH: 21.4 pg — ABNORMAL LOW (ref 26.6–33.0)
MCHC: 31.4 g/dL — ABNORMAL LOW (ref 31.5–35.7)
MCV: 68 fL — ABNORMAL LOW (ref 79–97)
Monocytes Absolute: 0.4 10*3/uL (ref 0.1–0.9)
Monocytes: 10 %
Neutrophils Absolute: 2 10*3/uL (ref 1.4–7.0)
Neutrophils: 45 %
Platelets: 410 10*3/uL (ref 150–450)
RBC: 5.69 x10E6/uL — ABNORMAL HIGH (ref 3.77–5.28)
RDW: 15.8 % — ABNORMAL HIGH (ref 11.7–15.4)
WBC: 4.5 10*3/uL (ref 3.4–10.8)

## 2019-08-08 LAB — TSH: TSH: 0.489 u[IU]/mL (ref 0.450–4.500)

## 2019-08-08 LAB — FSH/LH
FSH: 61.9 m[IU]/mL
LH: 34.3 m[IU]/mL

## 2019-08-08 LAB — URINE CYTOLOGY ANCILLARY ONLY
Chlamydia: NEGATIVE
Comment: NEGATIVE
Comment: NORMAL
Neisseria Gonorrhea: NEGATIVE

## 2019-08-08 LAB — T4, FREE: Free T4: 1.68 ng/dL (ref 0.82–1.77)

## 2019-08-08 NOTE — Telephone Encounter (Signed)
Test added.   

## 2019-08-08 NOTE — Telephone Encounter (Signed)
-----   Message from Malva Limes, MD sent at 08/08/2019  7:57 AM EST ----- Please add ferritin and iron level to labs drawn on 08/08/2019

## 2019-08-09 ENCOUNTER — Telehealth: Payer: Self-pay

## 2019-08-09 LAB — SPECIMEN STATUS REPORT

## 2019-08-09 LAB — IRON: Iron: 51 ug/dL (ref 27–159)

## 2019-08-09 LAB — FERRITIN: Ferritin: 174 ng/mL — ABNORMAL HIGH (ref 15–150)

## 2019-08-09 MED ORDER — METRONIDAZOLE 0.75 % VA GEL: 1.0000 | Freq: Every day | VAGINAL | 0 refills | Status: AC

## 2019-08-09 NOTE — Telephone Encounter (Signed)
Patient advised and agrees to start medication. Which dose of the Metronidazole gel should I send in?

## 2019-08-09 NOTE — Telephone Encounter (Signed)
-----   Message from Malva Limes, MD sent at 08/08/2019  4:55 PM EST ----- GC and chlamydia are negative. She likely has bacterial vaginosis. Recommend metronidazole vaginal gel QHS x 7 days.

## 2019-08-11 ENCOUNTER — Other Ambulatory Visit: Payer: Self-pay | Admitting: Family Medicine

## 2019-08-11 ENCOUNTER — Encounter: Payer: Self-pay | Admitting: Family Medicine

## 2019-08-11 DIAGNOSIS — R7989 Other specified abnormal findings of blood chemistry: Secondary | ICD-10-CM | POA: Insufficient documentation

## 2019-08-11 NOTE — Telephone Encounter (Signed)
Requested medication (s) are due for refill today: yes  Requested medication (s) are on the active medication list:yes  Last refill: 05/10/19 need OV for refill  Future visit scheduled:No  Notes to clinic:  Had visit for different issue  days ago    Requested Prescriptions  Pending Prescriptions Disp Refills   lovastatin (MEVACOR) 10 MG tablet [Pharmacy Med Name: Lovastatin 10 MG Oral Tablet] 30 tablet 0    Sig: TAKE 1 TABLET BY MOUTH ONCE DAILY. NEED OFFICE VISIT FOR FOLLOW UP      Cardiovascular:  Antilipid - Statins Failed - 08/11/2019  4:16 PM      Failed - Total Cholesterol in normal range and within 360 days    Cholesterol, Total  Date Value Ref Range Status  08/07/2019 211 (H) 100 - 199 mg/dL Final          Failed - LDL in normal range and within 360 days    LDL Chol Calc (NIH)  Date Value Ref Range Status  08/07/2019 127 (H) 0 - 99 mg/dL Final          Failed - Triglycerides in normal range and within 360 days    Triglycerides  Date Value Ref Range Status  08/07/2019 196 (H) 0 - 149 mg/dL Final          Passed - HDL in normal range and within 360 days    HDL  Date Value Ref Range Status  08/07/2019 49 >39 mg/dL Final          Passed - Patient is not pregnant      Passed - Valid encounter within last 12 months    Recent Outpatient Visits           4 days ago Vaginal odor   [facility] Malva Limes, MD   11 months ago Hematuria, unspecified type   [facility] Malva Limes, MD   1 year ago Annual physical exam   [facility] Malva Limes, MD   2 years ago Annual physical exam   [facility] Malva Limes, MD   3 years ago HLD (hyperlipidemia)   [facility] Sherrie Mustache, Demetrios Isaacs, MD

## 2019-08-17 ENCOUNTER — Encounter: Payer: Self-pay | Admitting: Family Medicine

## 2019-08-18 NOTE — Telephone Encounter (Signed)
Patient advised as below.  

## 2019-08-18 NOTE — Telephone Encounter (Signed)
Slightly high cholesterol, no need to change medications, just be careful with diet and avoid saturated fats, otherwise normal.  Labs should all be on Mychart.

## 2019-08-26 ENCOUNTER — Other Ambulatory Visit: Payer: Self-pay | Admitting: Family Medicine

## 2019-09-12 DIAGNOSIS — E785 Hyperlipidemia, unspecified: Secondary | ICD-10-CM | POA: Insufficient documentation

## 2019-09-12 DIAGNOSIS — Z6841 Body Mass Index (BMI) 40.0 and over, adult: Secondary | ICD-10-CM | POA: Insufficient documentation

## 2019-09-12 DIAGNOSIS — E039 Hypothyroidism, unspecified: Secondary | ICD-10-CM | POA: Insufficient documentation

## 2019-10-08 ENCOUNTER — Other Ambulatory Visit: Payer: Self-pay | Admitting: Family Medicine

## 2019-10-16 ENCOUNTER — Other Ambulatory Visit: Payer: Self-pay | Admitting: Family Medicine

## 2019-10-17 MED ORDER — LOVASTATIN 10 MG PO TABS: ORAL_TABLET | ORAL | 12 refills | Status: DC

## 2019-10-17 NOTE — Telephone Encounter (Signed)
Refill was refused by Esther Hardy, RN. on 10-16-2019. No explanation.  I send refill today.

## 2019-10-17 NOTE — Telephone Encounter (Signed)
Patient advised.

## 2019-10-17 NOTE — Addendum Note (Signed)
Addended by: Malva Limes on: 10/17/2019 01:32 PM   Modules accepted: Orders

## 2019-10-17 NOTE — Telephone Encounter (Signed)
Patient would like to know why this medication was denied. She states she was seen last month for a medication follow up visit.

## 2019-12-22 ENCOUNTER — Telehealth: Payer: Self-pay | Admitting: Family Medicine

## 2019-12-22 DIAGNOSIS — Z1231 Encounter for screening mammogram for malignant neoplasm of breast: Secondary | ICD-10-CM

## 2019-12-22 NOTE — Telephone Encounter (Signed)
Copied from CRM [id number]. Topic: General - Inquiry >> Dec 22, 2019  4:46 PM Floria Raveling A wrote: Reason for CRM: pt called and stated that she would like know if It is time for her to have a Mammogram if so she would like a order put in and sent to [facility] Best number [phone].

## 2019-12-25 NOTE — Addendum Note (Signed)
Addended by: Sherre Poot on: 12/25/2019 10:05 AM   Modules accepted: Orders

## 2019-12-25 NOTE — Telephone Encounter (Signed)
Mammogram ordered. Patient advised. 

## 2020-01-01 ENCOUNTER — Ambulatory Visit
Admission: RE | Admit: 2020-01-01 | Discharge: 2020-01-01 | Disposition: A | Discharge status: Home / Self Care | Payer: Commercial Managed Care - PPO | Source: Ambulatory Visit | Attending: Family Medicine | Admitting: Family Medicine

## 2020-01-01 ENCOUNTER — Other Ambulatory Visit: Payer: Self-pay

## 2020-01-01 DIAGNOSIS — Z1231 Encounter for screening mammogram for malignant neoplasm of breast: Secondary | ICD-10-CM

## 2020-03-27 ENCOUNTER — Ambulatory Visit: Payer: Commercial Managed Care - PPO | Admitting: Physician Assistant

## 2020-03-27 ENCOUNTER — Telehealth: Payer: Self-pay

## 2020-03-27 NOTE — Telephone Encounter (Signed)
Called patient no answer. Patient on the schedule for Friday for labs. If it only for labs patient doesn't need an appointment with the provider.She can only come for labs. If only labs what labs is she needing? If patient calls back ok for [facility] nurse to ask questions please about this appointment please? Or why are we seeing patient? Does she need a physical or follow up?

## 2020-03-28 NOTE — Telephone Encounter (Signed)
FYI

## 2020-03-28 NOTE — Telephone Encounter (Signed)
Pt. States she thought she was due to come in for lab work this Friday, but she cancelled the appointment. States she is not due for a physical until January.

## 2020-03-29 ENCOUNTER — Ambulatory Visit: Payer: Commercial Managed Care - PPO | Admitting: Physician Assistant

## 2020-04-17 ENCOUNTER — Ambulatory Visit: Payer: Self-pay | Admitting: *Deleted

## 2020-04-17 NOTE — Telephone Encounter (Signed)
Patient is calling to report she had bad headache yesterday and did not feel good- her BP was elevated at 136/100. Patient is at work today- she feels better- but does not know what her BP is now and can not check it until she gets home. Patient given perimeters for BP readings- ED, 4 hour and 24 hour criteria.  Patient is scheduled for appointment Friday morning- but is advised if her numbers should meet criteria follow plan. She states understanding and knows if she should developed symptoms to seek care. Reason for Disposition . Wants doctor to measure BP    Patient is reporting reading from yesterday- no symptoms today- and patient is at work now with no way to check BP. Appointment has been scheduled and patient has been given instructions for symptoms and readings.  Answer Assessment - Initial Assessment Questions 1. BLOOD PRESSURE: "What is the blood pressure?" "Did you take at least two measurements 5 minutes apart?"     136/100 2. ONSET: "When did you take your blood pressure?"     yesterday 3. HOW: "How did you obtain the blood pressure?" (e.g., visiting nurse, automatic home BP monitor)     Automatic arm cuff 4. HISTORY: "Do you have a history of high blood pressure?"     yes 5. MEDICATIONS: "Are you taking any medications for blood pressure?" "Have you missed any doses recently?"     No- medication for BP 6. OTHER SYMPTOMS: "Do you have any symptoms?" (e.g., headache, chest pain, blurred vision, difficulty breathing, weakness)     Headache,dizziness, sometime feels palpations, sweat sometimes 7. PREGNANCY: "Is there any chance you are pregnant?" "When was your last menstrual period?"     n/a  Protocols used: BLOOD PRESSURE - HIGH-A-AH

## 2020-04-19 ENCOUNTER — Ambulatory Visit: Payer: BC Managed Care – PPO | Admitting: Physician Assistant

## 2020-04-19 ENCOUNTER — Encounter: Payer: Self-pay | Admitting: Physician Assistant

## 2020-04-19 ENCOUNTER — Other Ambulatory Visit: Payer: Self-pay

## 2020-04-19 VITALS — BP 157/98 | HR 78 | Temp 98.6°F | Ht 61.0 in | Wt 233.0 lb

## 2020-04-19 DIAGNOSIS — I10 Essential (primary) hypertension: Secondary | ICD-10-CM

## 2020-04-19 MED ORDER — AMLODIPINE BESYLATE 5 MG PO TABS: 5.0000 mg | ORAL_TABLET | Freq: Every day | ORAL | 1 refills | Status: DC

## 2020-04-19 NOTE — Patient Instructions (Signed)
Hypertension, Adult High blood pressure (hypertension) is when the force of blood pumping through the arteries is too strong. The arteries are the blood vessels that carry blood from the heart throughout the body. Hypertension forces the heart to work harder to pump blood and may cause arteries to become narrow or stiff. Untreated or uncontrolled hypertension can cause a heart attack, heart failure, a stroke, kidney disease, and other problems. A blood pressure reading consists of a higher number over a lower number. Ideally, your blood pressure should be below 120/80. The first ("top") number is called the systolic pressure. It is a measure of the pressure in your arteries as your heart beats. The second ("bottom") number is called the diastolic pressure. It is a measure of the pressure in your arteries as the heart relaxes. What are the causes? The exact cause of this condition is not known. There are some conditions that result in or are related to high blood pressure. What increases the risk? Some risk factors for high blood pressure are under your control. The following factors may make you more likely to develop this condition:  Smoking.  Having type 2 diabetes mellitus, high cholesterol, or both.  Not getting enough exercise or physical activity.  Being overweight.  Having too much fat, sugar, calories, or salt (sodium) in your diet.  Drinking too much alcohol. Some risk factors for high blood pressure may be difficult or impossible to change. Some of these factors include:  Having chronic kidney disease.  Having a family history of high blood pressure.  Age. Risk increases with age.  Race. You may be at higher risk if you are African American.  Gender. Men are at higher risk than women before age 45. After age 65, women are at higher risk than men.  Having obstructive sleep apnea.  Stress. What are the signs or symptoms? High blood pressure may not cause symptoms. Very high  blood pressure (hypertensive crisis) may cause:  Headache.  Anxiety.  Shortness of breath.  Nosebleed.  Nausea and vomiting.  Vision changes.  Severe chest pain.  Seizures. How is this diagnosed? This condition is diagnosed by measuring your blood pressure while you are seated, with your arm resting on a flat surface, your legs uncrossed, and your feet flat on the floor. The cuff of the blood pressure monitor will be placed directly against the skin of your upper arm at the level of your heart. It should be measured at least twice using the same arm. Certain conditions can cause a difference in blood pressure between your right and left arms. Certain factors can cause blood pressure readings to be lower or higher than normal for a short period of time:  When your blood pressure is higher when you are in a health care provider's office than when you are at home, this is called white coat hypertension. Most people with this condition do not need medicines.  When your blood pressure is higher at home than when you are in a health care provider's office, this is called masked hypertension. Most people with this condition may need medicines to control blood pressure. If you have a high blood pressure reading during one visit or you have normal blood pressure with other risk factors, you may be asked to:  Return on a different day to have your blood pressure checked again.  Monitor your blood pressure at home for 1 week or longer. If you are diagnosed with hypertension, you may have other blood or   imaging tests to help your health care provider understand your overall risk for other conditions. How is this treated? This condition is treated by making healthy lifestyle changes, such as eating healthy foods, exercising more, and reducing your alcohol intake. Your health care provider may prescribe medicine if lifestyle changes are not enough to get your blood pressure under control, and  if:  Your systolic blood pressure is above 130.  Your diastolic blood pressure is above 80. Your personal target blood pressure may vary depending on your medical conditions, your age, and other factors. Follow these instructions at home: Eating and drinking   Eat a diet that is high in fiber and potassium, and low in sodium, added sugar, and fat. An example eating plan is called the DASH (Dietary Approaches to Stop Hypertension) diet. To eat this way: ? Eat plenty of fresh fruits and vegetables. Try to fill one half of your plate at each meal with fruits and vegetables. ? Eat whole grains, such as whole-wheat pasta, brown rice, or whole-grain bread. Fill about one fourth of your plate with whole grains. ? Eat or drink low-fat dairy products, such as skim milk or low-fat yogurt. ? Avoid fatty cuts of meat, processed or cured meats, and poultry with skin. Fill about one fourth of your plate with lean proteins, such as fish, chicken without skin, beans, eggs, or tofu. ? Avoid pre-made and processed foods. These tend to be higher in sodium, added sugar, and fat.  Reduce your daily sodium intake. Most people with hypertension should eat less than 1,500 mg of sodium a day.  Do not drink alcohol if: ? Your health care provider tells you not to drink. ? You are pregnant, may be pregnant, or are planning to become pregnant.  If you drink alcohol: ? Limit how much you use to:  0-1 drink a day for women.  0-2 drinks a day for men. ? Be aware of how much alcohol is in your drink. In the [place] one drink equals one 12 oz bottle of beer (355 mL), one 5 oz glass of wine (148 mL), or one 1 oz glass of hard liquor (44 mL). Lifestyle   Work with your health care provider to maintain a healthy body weight or to lose weight. Ask what an ideal weight is for you.  Get at least 30 minutes of exercise most days of the week. Activities may include walking, swimming, or biking.  Include exercise to  strengthen your muscles (resistance exercise), such as Pilates or lifting weights, as part of your weekly exercise routine. Try to do these types of exercises for 30 minutes at least 3 days a week.  Do not use any products that contain nicotine or tobacco, such as cigarettes, e-cigarettes, and chewing tobacco. If you need help quitting, ask your health care provider.  Monitor your blood pressure at home as told by your health care provider.  Keep all follow-up visits as told by your health care provider. This is important. Medicines  Take over-the-counter and prescription medicines only as told by your health care provider. Follow directions carefully. Blood pressure medicines must be taken as prescribed.  Do not skip doses of blood pressure medicine. Doing this puts you at risk for problems and can make the medicine less effective.  Ask your health care provider about side effects or reactions to medicines that you should watch for. Contact a health care provider if you:  Think you are having a reaction to a medicine you   are taking.  Have headaches that keep coming back (recurring).  Feel dizzy.  Have swelling in your ankles.  Have trouble with your vision. Get help right away if you:  Develop a severe headache or confusion.  Have unusual weakness or numbness.  Feel faint.  Have severe pain in your chest or abdomen.  Vomit repeatedly.  Have trouble breathing. Summary  Hypertension is when the force of blood pumping through your arteries is too strong. If this condition is not controlled, it may put you at risk for serious complications.  Your personal target blood pressure may vary depending on your medical conditions, your age, and other factors. For most people, a normal blood pressure is less than 120/80.  Hypertension is treated with lifestyle changes, medicines, or a combination of both. Lifestyle changes include losing weight, eating a healthy, low-sodium diet,  exercising more, and limiting alcohol. This information is not intended to replace advice given to you by your health care provider. Make sure you discuss any questions you have with your health care provider. Document Revised: 03/09/2018 Document Reviewed: 03/09/2018 Elsevier Patient Education  2020 [employer] Inc.   Amlodipine Oral Tablets What is this medicine? AMLODIPINE (am LOE di peen) is a calcium channel blocker. It relaxes your blood vessels and decreases the amount of work the heart has to do. It treats high blood pressure and/or prevents chest pain (also called angina). This medicine may be used for other purposes; ask your health care provider or pharmacist if you have questions. COMMON BRAND NAME(S): Norvasc What should I tell my health care provider before I take this medicine? They need to know if you have any of these conditions:  heart disease  liver disease  an unusual or allergic reaction to amlodipine, other drugs, foods, dyes, or preservatives  pregnant or trying to get pregnant  breast-feeding How should I use this medicine? Take this drug by mouth. Take it as directed on the prescription label at the same time every day. You can take it with or without food. If it upsets your stomach, take it with food. Keep taking it unless your health care provider tells you to stop. Talk to your health care provider about the use of this drug in children. While it may be prescribed for children as young as 6 for selected conditions, precautions do apply. Overdosage: If you think you have taken too much of this medicine contact a poison control center or emergency room at once. NOTE: This medicine is only for you. Do not share this medicine with others. What if I miss a dose? If you miss a dose, take it as soon as you can. If it is almost time for your next dose, take only that dose. Do not take double or extra doses. What may interact with this medicine? This medicine may  interact with the following medications:  clarithromycin  cyclosporine  diltiazem  itraconazole  simvastatin  tacrolimus This list may not describe all possible interactions. Give your health care provider a list of all the medicines, herbs, non-prescription drugs, or dietary supplements you use. Also tell them if you smoke, drink alcohol, or use illegal drugs. Some items may interact with your medicine. What should I watch for while using this medicine? Visit your health care provider for regular checks on your progress. Check your blood pressure as directed. Ask your health care provider what your blood pressure should be. Also, find out when you should contact him or her. Do not treat yourself  for coughs, colds, or pain while you are using this drug without asking your health care provider for advice. Some drugs may increase your blood pressure. You may get drowsy or dizzy. Do not drive, use machinery, or do anything that needs mental alertness until you know how this drug affects you. Do not stand up or sit up quickly, especially if you are an older patient. This reduces the risk of dizzy or fainting spells. What side effects may I notice from receiving this medicine? Side effects that you should report to your doctor or health care provider as soon as possible:  allergic reactions (skin rash, itching or hives; swelling of the face, lips, or tongue)  heart attack (trouble breathing; pain or tightness in the chest, neck, back or arms; unusually weak or tired)  low blood pressure (dizziness; feeling faint or lightheaded, falls; unusually weak or tired) Side effects that usually do not require medical attention (report these to your doctor or health care provider if they continue or are bothersome):  facial flushing  nausea  palpitations  stomach pain  sudden weight gain  swelling of the ankles, feet, hands This list may not describe all possible side effects. Call your doctor  for medical advice about side effects. You may report side effects to FDA at 1-800-FDA-1088. Where should I keep my medicine? Keep out of the reach of children and pets. Store at room temperature between 59 and 86 degrees F (15 and 30 degrees C). Protect from light and moisture. Keep the container tightly closed. Throw away any unused drug after the expiration date. NOTE: This sheet is a summary. It may not cover all possible information. If you have questions about this medicine, talk to your doctor, pharmacist, or health care provider.  2020 Elsevier/Gold Standard (2019-04-04 19:39:45)   DASH Eating Plan DASH stands for "Dietary Approaches to Stop Hypertension." The DASH eating plan is a healthy eating plan that has been shown to reduce high blood pressure (hypertension). It may also reduce your risk for type 2 diabetes, heart disease, and stroke. The DASH eating plan may also help with weight loss. What are tips for following this plan?  General guidelines  Avoid eating more than 2,300 mg (milligrams) of salt (sodium) a day. If you have hypertension, you may need to reduce your sodium intake to 1,500 mg a day.  Limit alcohol intake to no more than 1 drink a day for nonpregnant women and 2 drinks a day for men. One drink equals 12 oz of beer, 5 oz of wine, or 1 oz of hard liquor.  Work with your health care provider to maintain a healthy body weight or to lose weight. Ask what an ideal weight is for you.  Get at least 30 minutes of exercise that causes your heart to beat faster (aerobic exercise) most days of the week. Activities may include walking, swimming, or biking.  Work with your health care provider or diet and nutrition specialist (dietitian) to adjust your eating plan to your individual calorie needs. Reading food labels   Check food labels for the amount of sodium per serving. Choose foods with less than 5 percent of the Daily Value of sodium. Generally, foods with less than  300 mg of sodium per serving fit into this eating plan.  To find whole grains, look for the word "whole" as the first word in the ingredient list. Shopping  Buy products labeled as "low-sodium" or "no salt added."  Buy fresh foods. Avoid canned  foods and premade or frozen meals. Cooking  Avoid adding salt when cooking. Use salt-free seasonings or herbs instead of table salt or sea salt. Check with your health care provider or pharmacist before using salt substitutes.  Do not fry foods. Cook foods using healthy methods such as baking, boiling, grilling, and broiling instead.  Cook with heart-healthy oils, such as olive, canola, soybean, or sunflower oil. Meal planning  Eat a balanced diet that includes: ? 5 or more servings of fruits and vegetables each day. At each meal, try to fill half of your plate with fruits and vegetables. ? Up to 6-8 servings of whole grains each day. ? Less than 6 oz of lean meat, poultry, or fish each day. A 3-oz serving of meat is about the same size as a deck of cards. One egg equals 1 oz. ? 2 servings of low-fat dairy each day. ? A serving of nuts, seeds, or beans 5 times each week. ? Heart-healthy fats. Healthy fats called Omega-3 fatty acids are found in foods such as flaxseeds and coldwater fish, like sardines, salmon, and mackerel.  Limit how much you eat of the following: ? Canned or prepackaged foods. ? Food that is high in trans fat, such as fried foods. ? Food that is high in saturated fat, such as fatty meat. ? Sweets, desserts, sugary drinks, and other foods with added sugar. ? Full-fat dairy products.  Do not salt foods before eating.  Try to eat at least 2 vegetarian meals each week.  Eat more home-cooked food and less restaurant, buffet, and fast food.  When eating at a restaurant, ask that your food be prepared with less salt or no salt, if possible. What foods are recommended? The items listed may not be a complete list. Talk with  your dietitian about what dietary choices are best for you. Grains Whole-grain or whole-wheat bread. Whole-grain or whole-wheat pasta. Brown rice. Orpah Cobb. Bulgur. Whole-grain and low-sodium cereals. Pita bread. Low-fat, low-sodium crackers. Whole-wheat flour tortillas. Vegetables Fresh or frozen vegetables (raw, steamed, roasted, or grilled). Low-sodium or reduced-sodium tomato and vegetable juice. Low-sodium or reduced-sodium tomato sauce and tomato paste. Low-sodium or reduced-sodium canned vegetables. Fruits All fresh, dried, or frozen fruit. Canned fruit in natural juice (without added sugar). Meat and other protein foods Skinless chicken or [place]. Ground chicken or [place]. Pork with fat trimmed off. Fish and seafood. Egg whites. Dried beans, peas, or lentils. Unsalted nuts, nut butters, and seeds. Unsalted canned beans. Lean cuts of beef with fat trimmed off. Low-sodium, lean deli meat. Dairy Low-fat (1%) or fat-free (skim) milk. Fat-free, low-fat, or reduced-fat cheeses. Nonfat, low-sodium ricotta or cottage cheese. Low-fat or nonfat yogurt. Low-fat, low-sodium cheese. Fats and oils Soft margarine without trans fats. Vegetable oil. Low-fat, reduced-fat, or light mayonnaise and salad dressings (reduced-sodium). Canola, safflower, olive, soybean, and sunflower oils. Avocado. Seasoning and other foods Herbs. Spices. Seasoning mixes without salt. Unsalted popcorn and pretzels. Fat-free sweets. What foods are not recommended? The items listed may not be a complete list. Talk with your dietitian about what dietary choices are best for you. Grains Baked goods made with fat, such as croissants, muffins, or some breads. Dry pasta or rice meal packs. Vegetables Creamed or fried vegetables. Vegetables in a cheese sauce. Regular canned vegetables (not low-sodium or reduced-sodium). Regular canned tomato sauce and paste (not low-sodium or reduced-sodium). Regular tomato and vegetable juice  (not low-sodium or reduced-sodium). Rosita Fire. Olives. Fruits Canned fruit in a light or heavy syrup. [place]  fruit. Fruit in cream or butter sauce. Meat and other protein foods Fatty cuts of meat. Ribs. Fried meat. Tomasa BlaseBacon. Sausage. Bologna and other processed lunch meats. Salami. Fatback. Hotdogs. Bratwurst. Salted nuts and seeds. Canned beans with added salt. Canned or smoked fish. Whole eggs or egg yolks. Chicken or [place] with skin. Dairy Whole or 2% milk, cream, and half-and-half. Whole or full-fat cream cheese. Whole-fat or sweetened yogurt. Full-fat cheese. Nondairy creamers. Whipped toppings. Processed cheese and cheese spreads. Fats and oils Butter. Stick margarine. Lard. Shortening. Ghee. Bacon fat. Tropical oils, such as coconut, palm kernel, or palm oil. Seasoning and other foods Salted popcorn and pretzels. Onion salt, garlic salt, seasoned salt, table salt, and sea salt. Worcestershire sauce. Tartar sauce. Barbecue sauce. Teriyaki sauce. Soy sauce, including reduced-sodium. Steak sauce. Canned and packaged gravies. Fish sauce. Oyster sauce. Cocktail sauce. Horseradish that you find on the shelf. Ketchup. Mustard. Meat flavorings and tenderizers. Bouillon cubes. Hot sauce and Tabasco sauce. Premade or packaged marinades. Premade or packaged taco seasonings. Relishes. Regular salad dressings. Where to find more information:  National Heart, Lung, and Blood Institute: PopSteam.iswww.nhlbi.nih.gov  American Heart Association: www.heart.org Summary  The DASH eating plan is a healthy eating plan that has been shown to reduce high blood pressure (hypertension). It may also reduce your risk for type 2 diabetes, heart disease, and stroke.  With the DASH eating plan, you should limit salt (sodium) intake to 2,300 mg a day. If you have hypertension, you may need to reduce your sodium intake to 1,500 mg a day.  When on the DASH eating plan, aim to eat more fresh fruits and vegetables, whole grains, lean  proteins, low-fat dairy, and heart-healthy fats.  Work with your health care provider or diet and nutrition specialist (dietitian) to adjust your eating plan to your individual calorie needs. This information is not intended to replace advice given to you by your health care provider. Make sure you discuss any questions you have with your health care provider. Document Revised: 06/11/2017 Document Reviewed: 06/22/2016 Elsevier Patient Education  2020 [employer]

## 2020-04-19 NOTE — Progress Notes (Signed)
Established patient visit   Patient: Laura Wilkinson   DOB: 08/06/1970   49 y.o. Female  MRN: [id number] Visit Date: 04/19/2020  Today's healthcare provider: Margaretann Loveless, PA-C   Chief Complaint  Patient presents with  . Hypertension   Subjective    HPI HPI    Hypertension    This is a new problem.  The problem is uncontrolled.  Agents associated with hypertension include no associated agents.  Anxiety: Absent.  Blurred vision: Absent.  Chest pain: Absent.  Chest pressure/discomfort: Absent.  Dyspnea: Absent.  Headaches: Present.  Lower extremity edema: Absent.  Orthopnea: Absent.  Palpitations: Absent.  Paroxysmal nocturnal dyspnea: Absent.  Syncope: Absent.  The patient exercises intermittently.  Patient's diet is low salt.       Last edited by Paschal Dopp, CMA on 04/19/2020  7:20 AM. (History)        Patient Active Problem List   Diagnosis Date Noted  . High serum ferritin 08/11/2019  . Post-menopausal 08/08/2019  . RBC microcytosis 02/17/2018  . S/P abdominal hysterectomy 02/16/2017  . Abnormal finding on radiology exam 10/23/2015  . Allergic rhinitis 10/23/2015  . Back pain 10/23/2015  . Fasciculation 10/23/2015  . Migraine headache 10/23/2015  . Hypothyroidism, postop 10/23/2015  . Seasonal affective disorder (HCC) 10/23/2015  . HLD (hyperlipidemia) 07/11/2009  . Social phobia 09/29/2005  . Obesity 07/13/1998   Past Medical History:  Diagnosis Date  . Allergy   . History of chicken pox   . Hyperlipidemia   . Migraine   . Social phobia   . Thyroid disease    Social History   Tobacco Use  . Smoking status: Never Smoker  . Smokeless tobacco: Never Used  Substance Use Topics  . Alcohol use: No    Alcohol/week: 0.0 standard drinks  . Drug use: No   Allergies  Allergen Reactions  . Phentermine Hcl     Other reaction(s): Headache Blurry Vision     Medications: Outpatient Medications Prior to Visit  Medication Sig  . Calcium  Carb-Cholecalciferol (CALCIUM 500+D PO) Take 2 tablets by mouth 2 (two) times daily.  Marland Kitchen glucosamine-chondroitin 500-400 MG tablet Take 1 tablet by mouth 3 (three) times daily.  Marland Kitchen levothyroxine (SYNTHROID) 125 MCG tablet Take 1 tablet by mouth once daily  . lovastatin (MEVACOR) 10 MG tablet TAKE 1 TABLET BY MOUTH ONCE DAILY  . Multiple Vitamins-Minerals (MULTIVITAMIN ADULT PO) Take 1 tablet by mouth daily.  . OMEGA-3 FATTY ACIDS PO Take 2 capsules by mouth daily.  Marland Kitchen spironolactone (ALDACTONE) 50 MG tablet Take 100 mg by mouth daily.   No facility-administered medications prior to visit.    Review of Systems  Constitutional: Negative.   Respiratory: Negative.   Cardiovascular: Negative.   Gastrointestinal: Negative.   Neurological: Positive for light-headedness and headaches. Negative for dizziness.      Objective    BP (!) 157/98 (BP Location: Left Arm, Patient Position: Sitting, Cuff Size: Large)   Pulse 78   Temp 98.6 F (37 C) (Oral)   Ht 5\' 1"  (1.549 m)   Wt 233 lb (105.7 kg)   BMI 44.02 kg/m    Physical Exam Vitals reviewed.  Constitutional:      General: She is not in acute distress.    Appearance: Normal appearance. She is well-developed. She is obese. She is not ill-appearing or diaphoretic.  HENT:     Head: Normocephalic and atraumatic.  Eyes:     General: No scleral icterus.  Extraocular Movements: Extraocular movements intact.     Pupils: Pupils are equal, round, and reactive to light.  Neck:     Thyroid: No thyromegaly.     Vascular: No carotid bruit or JVD.     Trachea: No tracheal deviation.  Cardiovascular:     Rate and Rhythm: Normal rate and regular rhythm.     Heart sounds: Normal heart sounds. No murmur heard.  No friction rub. No gallop.   Pulmonary:     Effort: Pulmonary effort is normal. No respiratory distress.     Breath sounds: Normal breath sounds. No wheezing or rales.  Musculoskeletal:     Cervical back: Normal range of motion and  neck supple. No tenderness.     Right lower leg: No edema.     Left lower leg: No edema.  Lymphadenopathy:     Cervical: No cervical adenopathy.  Skin:    General: Skin is warm and dry.     Capillary Refill: Capillary refill takes less than 2 seconds.  Neurological:     Mental Status: She is alert.     No results found for any visits on 04/19/20.  Assessment & Plan     1. Primary hypertension New. Will start amlodipine 5mg  as below. Discussed DASH diet and printed on AVS. F/U in 4 weeks.  - amLODipine (NORVASC) 5 MG tablet; Take 1 tablet (5 mg total) by mouth daily.  Dispense: 30 tablet; Refill: 1   No follow-ups on file.      , PA-C, have reviewed all documentation for this visit. The documentation on 04/23/20 for the exam, diagnosis, procedures, and orders are all accurate and complete.   06/23/20  [facility] [phone] (phone) [phone] (fax)  [facility]

## 2020-05-24 ENCOUNTER — Ambulatory Visit: Payer: BC Managed Care – PPO | Admitting: Physician Assistant

## 2020-05-24 ENCOUNTER — Encounter: Payer: Self-pay | Admitting: Physician Assistant

## 2020-05-24 ENCOUNTER — Other Ambulatory Visit: Payer: Self-pay

## 2020-05-24 VITALS — BP 128/84 | HR 71 | Temp 98.3°F | Resp 16 | Wt 236.9 lb

## 2020-05-24 DIAGNOSIS — Z6841 Body Mass Index (BMI) 40.0 and over, adult: Secondary | ICD-10-CM

## 2020-05-24 DIAGNOSIS — I10 Essential (primary) hypertension: Secondary | ICD-10-CM

## 2020-05-24 MED ORDER — AMLODIPINE BESYLATE 10 MG PO TABS: 10.0000 mg | ORAL_TABLET | Freq: Every day | ORAL | 3 refills | Status: DC

## 2020-05-24 NOTE — Progress Notes (Signed)
Established patient visit   Patient: Laura Wilkinson   DOB: January 27, 1971   49 y.o. Female  MRN: [id number] Visit Date: 05/24/2020  Today's healthcare provider: Margaretann Loveless, PA-C   Chief Complaint  Patient presents with  . Follow-up   Subjective    HPI  Follow up for Primary Hypertension  The patient was last seen for this 4 weeks ago. Changes made at last visit include Start Amlodipine 5mg .Discussed DASH diet and printed on AVS.  She reports excellent compliance with treatment. She feels that condition is Improved. She is not having side effects.  Readings at home 137-197/90-100.   BP Readings from Last 3 Encounters:  05/24/20 128/84  04/19/20 (!) 157/98  08/07/19 (!) 159/92    -----------------------------------------------------------------------------------------   Patient Active Problem List   Diagnosis Date Noted  . High serum ferritin 08/11/2019  . Post-menopausal 08/08/2019  . RBC microcytosis 02/17/2018  . S/P abdominal hysterectomy 02/16/2017  . Abnormal finding on radiology exam 10/23/2015  . Allergic rhinitis 10/23/2015  . Back pain 10/23/2015  . Fasciculation 10/23/2015  . Migraine headache 10/23/2015  . Hypothyroidism, postop 10/23/2015  . Seasonal affective disorder (HCC) 10/23/2015  . HLD (hyperlipidemia) 07/11/2009  . Social phobia 09/29/2005  . Obesity 07/13/1998   Past Medical History:  Diagnosis Date  . Allergy   . History of chicken pox   . Hyperlipidemia   . Migraine   . Social phobia   . Thyroid disease        Medications: Outpatient Medications Prior to Visit  Medication Sig  . Calcium Carb-Cholecalciferol (CALCIUM 500+D PO) Take 2 tablets by mouth 2 (two) times daily.  09/11/1998 levothyroxine (SYNTHROID) 125 MCG tablet Take 1 tablet by mouth once daily  . lovastatin (MEVACOR) 10 MG tablet TAKE 1 TABLET BY MOUTH ONCE DAILY  . OMEGA-3 FATTY ACIDS PO Take 2 capsules by mouth daily.  Marland Kitchen spironolactone (ALDACTONE) 50 MG tablet  Take 100 mg by mouth daily.  . [DISCONTINUED] amLODipine (NORVASC) 5 MG tablet Take 1 tablet (5 mg total) by mouth daily.  . [DISCONTINUED] glucosamine-chondroitin 500-400 MG tablet Take 1 tablet by mouth 3 (three) times daily.  . [DISCONTINUED] Multiple Vitamins-Minerals (MULTIVITAMIN ADULT PO) Take 1 tablet by mouth daily.   No facility-administered medications prior to visit.    Review of Systems  Constitutional: Negative.   Eyes: Negative for visual disturbance.  Respiratory: Negative for cough, chest tightness and shortness of breath.   Cardiovascular: Negative for chest pain, palpitations and leg swelling.  Neurological: Negative for dizziness and headaches.    Last CBC Lab Results  Component Value Date   WBC 4.5 08/07/2019   HGB 12.2 08/07/2019   HCT 38.8 08/07/2019   MCV 68 (L) 08/07/2019   MCH 21.4 (L) 08/07/2019   RDW 15.8 (H) 08/07/2019   PLT 410 08/07/2019   Last metabolic panel Lab Results  Component Value Date   GLUCOSE 78 08/07/2019   NA 142 08/07/2019   K 4.2 08/07/2019   CL 102 08/07/2019   CO2 22 08/07/2019   BUN 13 08/07/2019   CREATININE 0.83 08/07/2019   GFRNONAA 83 08/07/2019   GFRAA 96 08/07/2019   CALCIUM 8.8 08/07/2019   PROT 6.8 08/07/2019   ALBUMIN 4.4 08/07/2019   LABGLOB 2.4 08/07/2019   AGRATIO 1.8 08/07/2019   BILITOT 0.3 08/07/2019   ALKPHOS 60 08/07/2019   AST 16 08/07/2019   ALT 16 08/07/2019   ANIONGAP 5 (L) 04/12/2014      Objective  BP 128/84 (BP Location: Right Arm, Patient Position: Sitting, Cuff Size: Large)   Pulse 71   Temp 98.3 F (36.8 C) (Oral)   Resp 16   Wt 236 lb 14.4 oz (107.5 kg)   BMI 44.76 kg/m  BP Readings from Last 3 Encounters:  05/24/20 128/84  04/19/20 (!) 157/98  08/07/19 (!) 159/92   Wt Readings from Last 3 Encounters:  05/24/20 236 lb 14.4 oz (107.5 kg)  04/19/20 233 lb (105.7 kg)  08/07/19 226 lb 12.8 oz (102.9 kg)      Physical Exam Vitals reviewed.  Constitutional:       General: She is not in acute distress.    Appearance: Normal appearance. She is well-developed. She is obese. She is not ill-appearing or diaphoretic.  Cardiovascular:     Rate and Rhythm: Normal rate and regular rhythm.     Pulses: Normal pulses.     Heart sounds: Normal heart sounds. No murmur heard.  No friction rub. No gallop.   Pulmonary:     Effort: Pulmonary effort is normal. No respiratory distress.     Breath sounds: Normal breath sounds. No wheezing or rales.  Musculoskeletal:     Cervical back: Normal range of motion and neck supple.  Neurological:     Mental Status: She is alert.  Psychiatric:        Mood and Affect: Mood normal.        Thought Content: Thought content normal.      No results found for any visits on 05/24/20.  Assessment & Plan     1. Class 3 severe obesity due to excess calories with serious comorbidity and body mass index (BMI) of 40.0 to 44.9 in adult [facility]) Counseled patient on healthy lifestyle modifications including dieting and exercise.   2. Primary hypertension Since readings are still elevated at home, will increase amlodipine from 5mg  to 10mg . F/U in 4-6 weeks.  - amLODipine (NORVASC) 10 MG tablet; Take 1 tablet (10 mg total) by mouth daily.  Dispense: 30 tablet; Refill: 3   Return in about 5 weeks (around 07/01/2020).      , PA-C, have reviewed all documentation for this visit. The documentation on 05/28/20 for the exam, diagnosis, procedures, and orders are all accurate and complete.   Delmer Islam  [facility] [phone] (phone) [phone] (fax)  [facility]

## 2020-05-24 NOTE — Patient Instructions (Signed)
Amlodipine Oral Tablets What is this medicine? AMLODIPINE (am LOE di peen) is a calcium channel blocker. It relaxes your blood vessels and decreases the amount of work the heart has to do. It treats high blood pressure and/or prevents chest pain (also called angina). This medicine may be used for other purposes; ask your health care provider or pharmacist if you have questions. COMMON BRAND NAME(S): Norvasc What should I tell my health care provider before I take this medicine? They need to know if you have any of these conditions:  heart disease  liver disease  an unusual or allergic reaction to amlodipine, other drugs, foods, dyes, or preservatives  pregnant or trying to get pregnant  breast-feeding How should I use this medicine? Take this drug by mouth. Take it as directed on the prescription label at the same time every day. You can take it with or without food. If it upsets your stomach, take it with food. Keep taking it unless your health care provider tells you to stop. Talk to your health care provider about the use of this drug in children. While it may be prescribed for children as young as 6 for selected conditions, precautions do apply. Overdosage: If you think you have taken too much of this medicine contact a poison control center or emergency room at once. NOTE: This medicine is only for you. Do not share this medicine with others. What if I miss a dose? If you miss a dose, take it as soon as you can. If it is almost time for your next dose, take only that dose. Do not take double or extra doses. What may interact with this medicine? This medicine may interact with the following medications:  clarithromycin  cyclosporine  diltiazem  itraconazole  simvastatin  tacrolimus This list may not describe all possible interactions. Give your health care provider a list of all the medicines, herbs, non-prescription drugs, or dietary supplements you use. Also tell them if  you smoke, drink alcohol, or use illegal drugs. Some items may interact with your medicine. What should I watch for while using this medicine? Visit your health care provider for regular checks on your progress. Check your blood pressure as directed. Ask your health care provider what your blood pressure should be. Also, find out when you should contact him or her. Do not treat yourself for coughs, colds, or pain while you are using this drug without asking your health care provider for advice. Some drugs may increase your blood pressure. You may get drowsy or dizzy. Do not drive, use machinery, or do anything that needs mental alertness until you know how this drug affects you. Do not stand up or sit up quickly, especially if you are an older patient. This reduces the risk of dizzy or fainting spells. What side effects may I notice from receiving this medicine? Side effects that you should report to your doctor or health care provider as soon as possible:  allergic reactions (skin rash, itching or hives; swelling of the face, lips, or tongue)  heart attack (trouble breathing; pain or tightness in the chest, neck, back or arms; unusually weak or tired)  low blood pressure (dizziness; feeling faint or lightheaded, falls; unusually weak or tired) Side effects that usually do not require medical attention (report these to your doctor or health care provider if they continue or are bothersome):  facial flushing  nausea  palpitations  stomach pain  sudden weight gain  swelling of the ankles, feet, hands   This list may not describe all possible side effects. Call your doctor for medical advice about side effects. You may report side effects to FDA at 1-800-FDA-1088. Where should I keep my medicine? Keep out of the reach of children and pets. Store at room temperature between 59 and 86 degrees F (15 and 30 degrees C). Protect from light and moisture. Keep the container tightly closed. Throw away  any unused drug after the expiration date. NOTE: This sheet is a summary. It may not cover all possible information. If you have questions about this medicine, talk to your doctor, pharmacist, or health care provider.  2020 Elsevier/Gold Standard (2019-04-04 19:39:45)  

## 2020-06-14 ENCOUNTER — Other Ambulatory Visit: Payer: Self-pay | Admitting: Physician Assistant

## 2020-06-14 DIAGNOSIS — I10 Essential (primary) hypertension: Secondary | ICD-10-CM

## 2020-06-21 ENCOUNTER — Other Ambulatory Visit: Payer: Self-pay | Admitting: Physician Assistant

## 2020-06-21 DIAGNOSIS — I10 Essential (primary) hypertension: Secondary | ICD-10-CM

## 2020-06-23 ENCOUNTER — Other Ambulatory Visit: Payer: Self-pay | Admitting: Physician Assistant

## 2020-06-23 DIAGNOSIS — I10 Essential (primary) hypertension: Secondary | ICD-10-CM

## 2020-06-28 ENCOUNTER — Other Ambulatory Visit: Payer: Self-pay | Admitting: Family Medicine

## 2020-07-19 ENCOUNTER — Ambulatory Visit: Payer: Self-pay | Admitting: Physician Assistant

## 2020-07-26 ENCOUNTER — Ambulatory Visit: Payer: Self-pay | Admitting: Physician Assistant

## 2020-07-29 ENCOUNTER — Ambulatory Visit: Payer: Self-pay | Admitting: Physician Assistant

## 2020-08-09 ENCOUNTER — Ambulatory Visit: Payer: BC Managed Care – PPO | Admitting: Physician Assistant

## 2020-08-09 ENCOUNTER — Ambulatory Visit: Payer: Self-pay | Admitting: Physician Assistant

## 2020-08-16 ENCOUNTER — Ambulatory Visit: Payer: BC Managed Care – PPO | Admitting: Physician Assistant

## 2020-08-16 ENCOUNTER — Other Ambulatory Visit: Payer: Self-pay

## 2020-08-16 ENCOUNTER — Encounter: Payer: Self-pay | Admitting: Physician Assistant

## 2020-08-16 VITALS — BP 131/85 | HR 79 | Temp 98.8°F | Wt 234.0 lb

## 2020-08-16 DIAGNOSIS — E890 Postprocedural hypothyroidism: Secondary | ICD-10-CM | POA: Diagnosis not present

## 2020-08-16 DIAGNOSIS — E7800 Pure hypercholesterolemia, unspecified: Secondary | ICD-10-CM

## 2020-08-16 DIAGNOSIS — I10 Essential (primary) hypertension: Secondary | ICD-10-CM | POA: Insufficient documentation

## 2020-08-16 DIAGNOSIS — Z1211 Encounter for screening for malignant neoplasm of colon: Secondary | ICD-10-CM

## 2020-08-16 DIAGNOSIS — Z1159 Encounter for screening for other viral diseases: Secondary | ICD-10-CM

## 2020-08-16 DIAGNOSIS — Z6841 Body Mass Index (BMI) 40.0 and over, adult: Secondary | ICD-10-CM

## 2020-08-16 DIAGNOSIS — F338 Other recurrent depressive disorders: Secondary | ICD-10-CM | POA: Diagnosis not present

## 2020-08-16 DIAGNOSIS — Z114 Encounter for screening for human immunodeficiency virus [HIV]: Secondary | ICD-10-CM

## 2020-08-16 NOTE — Patient Instructions (Signed)

## 2020-08-16 NOTE — Progress Notes (Signed)
Established patient visit   Patient: Laura Wilkinson   DOB: 1971/07/10   50 y.o. Female  MRN: [id number] Visit Date: 08/16/2020  Today's healthcare provider: Margaretann Loveless, PA-C   Chief Complaint  Patient presents with  . Hypertension   Subjective    HPI  Hypertension, follow-up  BP Readings from Last 3 Encounters:  08/16/20 131/85  05/24/20 128/84  04/19/20 (!) 157/98   Wt Readings from Last 3 Encounters:  08/16/20 234 lb (106.1 kg)  05/24/20 236 lb 14.4 oz (107.5 kg)  04/19/20 233 lb (105.7 kg)     She was last seen for hypertension 6 months ago.  BP at that visit was 128/84. Management since that visit includes continue current treatment.  She reports excellent compliance with treatment. She is not having side effects.  She is following a Low Sodium diet. She is exercising. She does not smoke.  Use of agents associated with hypertension: none.   Outside blood pressures are normal at home. Symptoms: No chest pain No chest pressure  No palpitations No syncope  No dyspnea No orthopnea  No paroxysmal nocturnal dyspnea No lower extremity edema   Pertinent labs: Lab Results  Component Value Date   CHOL 211 (H) 08/07/2019   HDL 49 08/07/2019   LDLCALC 127 (H) 08/07/2019   TRIG 196 (H) 08/07/2019   CHOLHDL 4.3 08/07/2019   Lab Results  Component Value Date   NA 142 08/07/2019   K 4.2 08/07/2019   CREATININE 0.83 08/07/2019   GFRNONAA 83 08/07/2019   GFRAA 96 08/07/2019   GLUCOSE 78 08/07/2019     The 10-year ASCVD risk score Denman George DC Jr., et al., 2013) is: 4.7%   ---------------------------------------------------------------------------------------------------   Patient Active Problem List   Diagnosis Date Noted  . High serum ferritin 08/11/2019  . Post-menopausal 08/08/2019  . RBC microcytosis 02/17/2018  . S/P abdominal hysterectomy 02/16/2017  . Abnormal finding on radiology exam 10/23/2015  . Allergic rhinitis 10/23/2015  . Back  pain 10/23/2015  . Fasciculation 10/23/2015  . Migraine headache 10/23/2015  . Hypothyroidism, postop 10/23/2015  . Seasonal affective disorder (HCC) 10/23/2015  . HLD (hyperlipidemia) 07/11/2009  . Social phobia 09/29/2005  . Obesity 07/13/1998   Past Medical History:  Diagnosis Date  . Allergy   . History of chicken pox   . Hyperlipidemia   . Migraine   . Social phobia   . Thyroid disease    Social History   Tobacco Use  . Smoking status: Never Smoker  . Smokeless tobacco: Never Used  Substance Use Topics  . Alcohol use: No    Alcohol/week: 0.0 standard drinks  . Drug use: No   Allergies  Allergen Reactions  . Phentermine Hcl     Other reaction(s): Headache Blurry Vision     Medications: Outpatient Medications Prior to Visit  Medication Sig  . amLODipine (NORVASC) 10 MG tablet Take 1 tablet (10 mg total) by mouth daily.  . Calcium Carb-Cholecalciferol (CALCIUM 500+D PO) Take 2 tablets by mouth 2 (two) times daily.  Marland Kitchen levothyroxine (SYNTHROID) 125 MCG tablet Take 1 tablet by mouth once daily  . lovastatin (MEVACOR) 10 MG tablet TAKE 1 TABLET BY MOUTH ONCE DAILY  . OMEGA-3 FATTY ACIDS PO Take 2 capsules by mouth daily.  Marland Kitchen spironolactone (ALDACTONE) 50 MG tablet Take 100 mg by mouth daily.   No facility-administered medications prior to visit.    Review of Systems  Constitutional: Negative.   HENT: Positive for congestion, ear  pain, postnasal drip, rhinorrhea and sinus pressure. Negative for sinus pain and sneezing.   Eyes: Negative.   Respiratory: Positive for cough. Negative for apnea, choking, chest tightness, shortness of breath, wheezing and stridor.   Cardiovascular: Negative.   Gastrointestinal: Negative.   Neurological: Negative for dizziness, light-headedness and headaches.       Objective    BP 131/85 (BP Location: Left Arm, Patient Position: Sitting, Cuff Size: Large)   Pulse 79   Temp 98.8 F (37.1 C) (Oral)   Wt 234 lb (106.1 kg)   BMI  44.21 kg/m    Physical Exam Vitals reviewed.  Constitutional:      General: She is not in acute distress.    Appearance: Normal appearance. She is well-developed and well-nourished. She is obese. She is not ill-appearing or diaphoretic.  HENT:     Head: Normocephalic and atraumatic.  Cardiovascular:     Rate and Rhythm: Normal rate and regular rhythm.     Heart sounds: Normal heart sounds. No murmur heard. No friction rub. No gallop.   Pulmonary:     Effort: Pulmonary effort is normal. No respiratory distress.     Breath sounds: Normal breath sounds. No wheezing or rales.  Musculoskeletal:     Cervical back: Normal range of motion and neck supple.  Neurological:     Mental Status: She is alert.      No results found for any visits on 08/16/20.  Assessment & Plan     1. Hypothyroidism, postop Stable. Continue Levothyroxine . Will check labs as below and f/u pending results. - CBC w/Diff/Platelet - Comprehensive Metabolic Panel (CMET) - TSH  2. Primary hypertension Stable. Continue Amlodipine 10mg . Will check labs as below and f/u pending results. - CBC w/Diff/Platelet - Comprehensive Metabolic Panel (CMET) - Lipid Panel With LDL/HDL Ratio - HgB A1c  3. Seasonal affective disorder (HCC) Well controlled. Will check labs as below and f/u pending results. - CBC w/Diff/Platelet - Comprehensive Metabolic Panel (CMET) - TSH  4. Class 3 severe obesity due to excess calories with serious comorbidity and body mass index (BMI) of 40.0 to 44.9 in adult [facility]) Counseled patient on healthy lifestyle modifications including dieting and exercise.  Will check labs as below and f/u pending results. - CBC w/Diff/Platelet - Comprehensive Metabolic Panel (CMET) - TSH - Lipid Panel With LDL/HDL Ratio - HgB A1c  5. Pure hypercholesterolemia Stable. Continue Lovastatin 10mg . Will check labs as below and f/u pending results. - CBC w/Diff/Platelet - Comprehensive Metabolic Panel  (CMET) - TSH - Lipid Panel With LDL/HDL Ratio - HgB A1c  6. Encounter for hepatitis C screening test for low risk patient Will check labs as below and f/u pending results. - Hepatitis C Antibody  7. Screening for HIV without presence of risk factors Will check labs as below and f/u pending results. - HIV antibody (with reflex)  8. Colon cancer screening Due for colon cancer screening. Referral for colonoscopy placed. - Ambulatory referral to Gastroenterology   No follow-ups on file.      [facility], PA-C, have reviewed all documentation for this visit. The documentation on 08/16/20 for the exam, diagnosis, procedures, and orders are all accurate and complete.   Delmer Islam  [facility] [phone] (phone) [phone] (fax)  [facility]

## 2020-08-17 LAB — LIPID PANEL WITH LDL/HDL RATIO
Cholesterol, Total: 263 mg/dL — ABNORMAL HIGH (ref 100–199)
HDL: 53 mg/dL (ref >39)
LDL Chol Calc (NIH): 181 mg/dL — ABNORMAL HIGH (ref 0–99)
LDL/HDL Ratio: 3.4 ratio — ABNORMAL HIGH (ref 0.0–3.2)
Triglycerides: 156 mg/dL — ABNORMAL HIGH (ref 0–149)
VLDL Cholesterol Cal: 29 mg/dL (ref 5–40)

## 2020-08-17 LAB — CBC WITH DIFFERENTIAL/PLATELET
Basophils Absolute: 0.1 10*3/uL (ref 0.0–0.2)
Basos: 1 %
EOS (ABSOLUTE): 0.1 10*3/uL (ref 0.0–0.4)
Eos: 3 %
Hematocrit: 41.2 % (ref 34.0–46.6)
Hemoglobin: 12.8 g/dL (ref 11.1–15.9)
Immature Grans (Abs): 0 10*3/uL (ref 0.0–0.1)
Immature Granulocytes: 0 %
Lymphocytes Absolute: 2.1 10*3/uL (ref 0.7–3.1)
Lymphs: 39 %
MCH: 21.3 pg — ABNORMAL LOW (ref 26.6–33.0)
MCHC: 31.1 g/dL — ABNORMAL LOW (ref 31.5–35.7)
MCV: 69 fL — ABNORMAL LOW (ref 79–97)
Monocytes Absolute: 0.5 10*3/uL (ref 0.1–0.9)
Monocytes: 10 %
Neutrophils Absolute: 2.5 10*3/uL (ref 1.4–7.0)
Neutrophils: 47 %
Platelets: 481 10*3/uL — ABNORMAL HIGH (ref 150–450)
RBC: 6.01 x10E6/uL — ABNORMAL HIGH (ref 3.77–5.28)
RDW: 16.1 % — ABNORMAL HIGH (ref 11.7–15.4)
WBC: 5.3 10*3/uL (ref 3.4–10.8)

## 2020-08-17 LAB — COMPREHENSIVE METABOLIC PANEL
ALT: 31 IU/L (ref 0–32)
AST: 23 IU/L (ref 0–40)
Albumin/Globulin Ratio: 2 (ref 1.2–2.2)
Albumin: 4.9 g/dL — ABNORMAL HIGH (ref 3.8–4.8)
Alkaline Phosphatase: 62 IU/L (ref 44–121)
BUN/Creatinine Ratio: 11 (ref 9–23)
BUN: 11 mg/dL (ref 6–24)
Bilirubin Total: 0.3 mg/dL (ref 0.0–1.2)
CO2: 21 mmol/L (ref 20–29)
Calcium: 9.4 mg/dL (ref 8.7–10.2)
Chloride: 100 mmol/L (ref 96–106)
Creatinine, Ser: 0.97 mg/dL (ref 0.57–1.00)
GFR calc Af Amer: 79 mL/min/{1.73_m2} (ref >59)
GFR calc non Af Amer: 68 mL/min/{1.73_m2} (ref >59)
Globulin, Total: 2.5 g/dL (ref 1.5–4.5)
Glucose: 88 mg/dL (ref 65–99)
Potassium: 4.7 mmol/L (ref 3.5–5.2)
Sodium: 139 mmol/L (ref 134–144)
Total Protein: 7.4 g/dL (ref 6.0–8.5)

## 2020-08-17 LAB — HEMOGLOBIN A1C
Est. average glucose Bld gHb Est-mCnc: 114 mg/dL
Hgb A1c MFr Bld: 5.6 % (ref 4.8–5.6)

## 2020-08-17 LAB — TSH: TSH: 0.494 u[IU]/mL (ref 0.450–4.500)

## 2020-08-17 LAB — HEPATITIS C ANTIBODY: Hep C Virus Ab: <0.1 s/co ratio (ref 0.0–0.9)

## 2020-08-17 LAB — HIV ANTIBODY (ROUTINE TESTING W REFLEX): HIV Screen 4th Generation wRfx: NONREACTIVE

## 2020-08-20 ENCOUNTER — Telehealth: Payer: Self-pay

## 2020-08-20 NOTE — Telephone Encounter (Signed)
Pt advised.  She has not been taking the Lovastatin, but she will restart now.   Thanks,   -Vernona Rieger

## 2020-08-20 NOTE — Telephone Encounter (Signed)
-----   Message from Margaretann Loveless, PA-C sent at 08/20/2020  9:38 AM EST ----- Laura Wilkinson,  Cholesterol is increased drastically compared to last year. Make sure to be taking lovastatin daily. If taking, we should increase the dose. All other labs are all normal and stable.   Daiva Nakayama, [facility]

## 2020-09-06 ENCOUNTER — Other Ambulatory Visit: Payer: Self-pay

## 2020-09-06 ENCOUNTER — Telehealth (INDEPENDENT_AMBULATORY_CARE_PROVIDER_SITE_OTHER): Payer: Self-pay | Admitting: Gastroenterology

## 2020-09-06 DIAGNOSIS — Z1211 Encounter for screening for malignant neoplasm of colon: Secondary | ICD-10-CM

## 2020-09-06 MED ORDER — PEG 3350-KCL-NA BICARB-NACL 420 G PO SOLR: 4000.0000 mL | Freq: Once | ORAL | 0 refills | Status: AC

## 2020-09-06 NOTE — Progress Notes (Signed)
Gastroenterology Pre-Procedure Review  Request Date: Friday 09/20/20 Requesting Physician: Dr. Servando Snare  PATIENT REVIEW QUESTIONS: The patient responded to the following health history questions as indicated:    1. Are you having any GI issues? no 2. Do you have a personal history of Polyps? no 3. Do you have a family history of Colon Cancer or Polyps? yes (mother colon polyps) 4. Diabetes Mellitus? no 5. Joint replacements in the past 12 months?no 6. Major health problems in the past 3 months?no 7. Any artificial heart valves, MVP, or defibrillator?no    MEDICATIONS & ALLERGIES:    Patient reports the following regarding taking any anticoagulation/antiplatelet therapy:   Plavix, Coumadin, Eliquis, Xarelto, Lovenox, Pradaxa, Brilinta, or Effient? no Aspirin? no  Patient confirms/reports the following medications:  Current Outpatient Medications  Medication Sig Dispense Refill  . amLODipine (NORVASC) 10 MG tablet Take 1 tablet (10 mg total) by mouth daily. 30 tablet 3  . Calcium Carb-Cholecalciferol (CALCIUM 500+D PO) Take 2 tablets by mouth 2 (two) times daily.    Marland Kitchen levothyroxine (SYNTHROID) 125 MCG tablet Take 1 tablet by mouth once daily 90 tablet 0  . lovastatin (MEVACOR) 10 MG tablet TAKE 1 TABLET BY MOUTH ONCE DAILY 30 tablet 12  . OMEGA-3 FATTY ACIDS PO Take 2 capsules by mouth daily.    . polyethylene glycol-electrolytes (NULYTELY) 420 g solution Take 4,000 mLs by mouth once for 1 dose. 4000 mL 0  . spironolactone (ALDACTONE) 50 MG tablet Take 100 mg by mouth daily.     No current facility-administered medications for this visit.    Patient confirms/reports the following allergies:  Allergies  Allergen Reactions  . Phentermine Hcl     Other reaction(s): Headache Blurry Vision    No orders of the defined types were placed in this encounter.   AUTHORIZATION INFORMATION Primary Insurance: 1D#: Group #:  Secondary Insurance: 1D#: Group #:  SCHEDULE  INFORMATION: Date: 09/20/20 Time: Location:[facility]

## 2020-09-16 ENCOUNTER — Other Ambulatory Visit: Payer: Self-pay

## 2020-09-16 ENCOUNTER — Encounter: Payer: Self-pay | Admitting: Gastroenterology

## 2020-09-18 ENCOUNTER — Other Ambulatory Visit: Payer: Self-pay

## 2020-09-18 ENCOUNTER — Other Ambulatory Visit
Admission: RE | Admit: 2020-09-18 | Discharge: 2020-09-18 | Disposition: A | Discharge status: Home / Self Care | Payer: BC Managed Care – PPO | Source: Ambulatory Visit | Attending: Gastroenterology | Admitting: Gastroenterology

## 2020-09-18 DIAGNOSIS — Z20822 Contact with and (suspected) exposure to covid-19: Secondary | ICD-10-CM | POA: Insufficient documentation

## 2020-09-18 DIAGNOSIS — Z79899 Other long term (current) drug therapy: Secondary | ICD-10-CM | POA: Diagnosis not present

## 2020-09-18 DIAGNOSIS — Z79890 Hormone replacement therapy (postmenopausal): Secondary | ICD-10-CM | POA: Diagnosis not present

## 2020-09-18 DIAGNOSIS — K640 First degree hemorrhoids: Secondary | ICD-10-CM | POA: Diagnosis not present

## 2020-09-18 DIAGNOSIS — Z01812 Encounter for preprocedural laboratory examination: Secondary | ICD-10-CM | POA: Insufficient documentation

## 2020-09-18 DIAGNOSIS — Z1211 Encounter for screening for malignant neoplasm of colon: Secondary | ICD-10-CM | POA: Diagnosis present

## 2020-09-18 LAB — SARS CORONAVIRUS 2 (TAT 6-24 HRS): SARS Coronavirus 2: NEGATIVE

## 2020-09-19 NOTE — Discharge Instructions (Signed)

## 2020-09-20 ENCOUNTER — Ambulatory Visit: Payer: BC Managed Care – PPO | Admitting: Anesthesiology

## 2020-09-20 ENCOUNTER — Other Ambulatory Visit: Payer: Self-pay

## 2020-09-20 ENCOUNTER — Encounter
Admission: RE | Disposition: A | Discharge status: Home / Self Care | Payer: Self-pay | Source: Home / Self Care | Attending: Gastroenterology

## 2020-09-20 ENCOUNTER — Encounter: Payer: Self-pay | Admitting: Gastroenterology

## 2020-09-20 ENCOUNTER — Ambulatory Visit
Admission: RE | Admit: 2020-09-20 | Discharge: 2020-09-20 | Disposition: A | Discharge status: Home / Self Care | Payer: BC Managed Care – PPO | Attending: Gastroenterology | Admitting: Gastroenterology

## 2020-09-20 DIAGNOSIS — K640 First degree hemorrhoids: Secondary | ICD-10-CM | POA: Insufficient documentation

## 2020-09-20 DIAGNOSIS — Z79899 Other long term (current) drug therapy: Secondary | ICD-10-CM | POA: Insufficient documentation

## 2020-09-20 DIAGNOSIS — Z79890 Hormone replacement therapy (postmenopausal): Secondary | ICD-10-CM | POA: Insufficient documentation

## 2020-09-20 DIAGNOSIS — Z1211 Encounter for screening for malignant neoplasm of colon: Secondary | ICD-10-CM | POA: Diagnosis not present

## 2020-09-20 DIAGNOSIS — Z20822 Contact with and (suspected) exposure to covid-19: Secondary | ICD-10-CM | POA: Insufficient documentation

## 2020-09-20 HISTORY — PX: COLONOSCOPY WITH PROPOFOL: SHX5780

## 2020-09-20 SURGERY — COLONOSCOPY WITH PROPOFOL
Anesthesia: General | Site: Rectum

## 2020-09-20 MED ORDER — LACTATED RINGERS IV SOLN: INTRAVENOUS | Status: DC

## 2020-09-20 MED ORDER — LIDOCAINE HCL (CARDIAC) PF 100 MG/5ML IV SOSY
PREFILLED_SYRINGE | INTRAVENOUS | Status: DC | PRN
  Administered 2020-09-20: 100 mg via INTRAVENOUS

## 2020-09-20 MED ORDER — GLYCOPYRROLATE 0.2 MG/ML IJ SOLN
INTRAMUSCULAR | Status: DC | PRN
  Administered 2020-09-20: .2 mg via INTRAVENOUS

## 2020-09-20 MED ORDER — PROPOFOL 10 MG/ML IV BOLUS
INTRAVENOUS | Status: DC | PRN
  Administered 2020-09-20 (×2): 50 mg via INTRAVENOUS
  Administered 2020-09-20: 100 mg via INTRAVENOUS

## 2020-09-20 MED ORDER — STERILE WATER FOR IRRIGATION IR SOLN: Status: DC | PRN

## 2020-09-20 SURGICAL SUPPLY — 6 items
GOWN CVR UNV OPN BCK APRN NK (MISCELLANEOUS) ×2 IMPLANT
GOWN ISOL THUMB LOOP REG UNIV (MISCELLANEOUS) ×4
KIT PRC NS LF DISP ENDO (KITS) ×1 IMPLANT
KIT PROCEDURE OLYMPUS (KITS) ×2
MANIFOLD NEPTUNE II (INSTRUMENTS) ×2 IMPLANT
WATER STERILE IRR 250ML POUR (IV SOLUTION) ×2 IMPLANT

## 2020-09-20 NOTE — Op Note (Signed)
[facility] Patient Name: Laura Wilkinson Procedure Date: 09/20/2020 9:24 AM MRN: [id number] Account #: [id number] Date of Birth: May 14, 1971 Admit Type: Outpatient Age: 50 Room: [facility] OR ROOM 01 Gender: Female Note Status: Finalized Procedure:             Colonoscopy Indications:           Screening for colorectal malignant neoplasm Providers:             Midge Minium MD, MD Referring MD:          Margaretann Loveless (Referring MD) Medicines:             Propofol per Anesthesia Complications:         No immediate complications. Procedure:             Pre-Anesthesia Assessment:                        - Prior to the procedure, a History and Physical was                         performed, and patient medications and allergies were                         reviewed. The patient's tolerance of previous                         anesthesia was also reviewed. The risks and benefits                         of the procedure and the sedation options and risks                         were discussed with the patient. All questions were                         answered, and informed consent was obtained. Prior                         Anticoagulants: The patient has taken no previous                         anticoagulant or antiplatelet agents. ASA Grade                         Assessment: II - A patient with mild systemic disease.                         After reviewing the risks and benefits, the patient                         was deemed in satisfactory condition to undergo the                         procedure.                        After obtaining informed consent, the colonoscope was  passed under direct vision. Throughout the procedure,                         the patient's blood pressure, pulse, and oxygen                         saturations were monitored continuously. The                         Colonoscope was introduced through  the anus and                         advanced to the the cecum, identified by appendiceal                         orifice and ileocecal valve. The colonoscopy was                         performed without difficulty. The patient tolerated                         the procedure well. The quality of the bowel                         preparation was excellent. Findings:      The perianal and digital rectal examinations were normal.      Non-bleeding internal hemorrhoids were found during retroflexion. The       hemorrhoids were Grade I (internal hemorrhoids that do not prolapse). Impression:            - Non-bleeding internal hemorrhoids.                        - No specimens collected. Recommendation:        - Discharge patient to home.                        - Resume previous diet.                        - Continue present medications.                        - Repeat colonoscopy in 10 years for screening                         purposes. Procedure Code(s):     --- Professional ---                        [phone], Colonoscopy, flexible; diagnostic, including                         collection of specimen(s) by brushing or washing, when                         performed (separate procedure) Diagnosis Code(s):     --- Professional ---                        Z12.11, Encounter for screening for malignant neoplasm  of colon CPT copyright 2019 American Medical Association. All rights reserved. The codes documented in this report are preliminary and upon coder review may  be revised to meet current compliance requirements. Midge Minium MD, MD 09/20/2020 9:46:24 AM This report has been signed electronically. Number of Addenda: 0 Note Initiated On: 09/20/2020 9:24 AM Scope Withdrawal Time: 0 hours 6 minutes 44 seconds  Total Procedure Duration: 0 hours 10 minutes 57 seconds  Estimated Blood Loss:  Estimated blood loss: none.      [facility]

## 2020-09-20 NOTE — H&P (Signed)
Midge Minium, MD [facility] [address] [place], [place] [address] Phone: [phone] Fax : [phone]  Primary Care Physician:  Margaretann Loveless, PA-C Primary Gastroenterologist:  Dr. Servando Snare  Pre-Procedure History & Physical: HPI:  Laura Wilkinson is a 50 y.o. female is here for a screening colonoscopy.   Past Medical History:  Diagnosis Date  . Allergy   . History of chicken pox   . Hyperlipidemia   . Migraine   . Social phobia   . Thyroid disease     Past Surgical History:  Procedure Laterality Date  . 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 THYROIDECTOMY  04/20/2014   Total. Done by Dr. Anda Kraft    Prior to Admission medications   Medication Sig Start Date End Date Taking? Authorizing Provider  amLODipine (NORVASC) 10 MG tablet Take 1 tablet (10 mg total) by mouth daily. 05/24/20  Yes Margaretann Loveless, PA-C  Calcium Carb-Cholecalciferol (CALCIUM 500+D PO) Take 2 tablets by mouth 2 (two) times daily.   Yes [provider]  Ferrous Sulfate (IRON PO) Take by mouth.   Yes [provider]  levothyroxine (SYNTHROID) 125 MCG tablet Take 1 tablet by mouth once daily 06/28/20  Yes Burnette, Victorino Dike M, PA-C  lovastatin (MEVACOR) 10 MG tablet TAKE 1 TABLET BY MOUTH ONCE DAILY 10/17/19  Yes Malva Limes, MD  MAGNESIUM PO Take by mouth daily.   Yes [provider]  Multiple Vitamin (MULTIVITAMIN) tablet Take 1 tablet by mouth daily.   Yes [provider]  OMEGA-3 FATTY ACIDS PO Take 2 capsules by mouth daily.   Yes [provider]  spironolactone (ALDACTONE) 50 MG tablet Take 100 mg by mouth daily.   Yes [provider]    Allergies as of 09/06/2020 - Review Complete 08/16/2020  Allergen Reaction Noted  . Phentermine hcl  10/23/2015    Family History  Problem Relation Age of Onset  . Hyperlipidemia Mother   . Hypertension Mother   . Hyperlipidemia Father   .  Hypertension Father   . Cancer Neg Hx   . Diabetes Neg Hx   . Heart attack Neg Hx   . Breast cancer Neg Hx     Social History   Socioeconomic History  . Marital status: Married    Spouse name: Not on file  . Number of children: Not on file  . Years of education: Not on file  . Highest education level: Not on file  Occupational History    Comment: Chiropodist associate  Tobacco Use  . Smoking status: Never Smoker  . Smokeless tobacco: Never Used  Vaping Use  . Vaping Use: Never used  Substance and Sexual Activity  . Alcohol use: No    Alcohol/week: 0.0 standard drinks  . Drug use: No  . Sexual activity: Not on file  Other Topics Concern  . Not on file  Social History Narrative  . Not on file   Social Determinants of Health   Financial Resource Strain: Not on file  Food Insecurity: Not on file  Transportation Needs: Not on file  Physical Activity: Not on file  Stress: Not on file  Social Connections: Not on file  Intimate Partner Violence: Not on file    Review of Systems: See HPI, otherwise negative ROS  Physical Exam: BP 136/78   Pulse 71   Temp (!) 97.1 F (36.2 C) (Temporal)   Ht 5\' 1"  (1.549 m)  Wt 105.2 kg   SpO2 100%   BMI 43.84 kg/m  General:   Alert,  pleasant and cooperative in NAD Head:  Normocephalic and atraumatic. Neck:  Supple; no masses or thyromegaly. Lungs:  Clear throughout to auscultation.    Heart:  Regular rate and rhythm. Abdomen:  Soft, nontender and nondistended. Normal bowel sounds, without guarding, and without rebound.   Neurologic:  Alert and  oriented x4;  grossly normal neurologically.  Impression/Plan: Laura Wilkinson is now here to undergo a screening colonoscopy.  Risks, benefits, and alternatives regarding colonoscopy have been reviewed with the patient.  Questions have been answered.  All parties agreeable.

## 2020-09-20 NOTE — Anesthesia Procedure Notes (Signed)
Procedure Name: MAC Date/Time: 09/20/2020 9:36 AM Performed by: Jeannene Patella, CRNA Pre-anesthesia Checklist: Patient identified, Emergency Drugs available, Suction available, Timeout performed and Patient being monitored Patient Re-evaluated:Patient Re-evaluated prior to induction Oxygen Delivery Method: Nasal cannula Placement Confirmation: positive ETCO2

## 2020-09-20 NOTE — Anesthesia Preprocedure Evaluation (Signed)
Anesthesia Evaluation  Patient identified by MRN, date of birth, ID band Patient awake    Reviewed: Allergy & Precautions, H&P , NPO status , Patient's Chart, lab work & pertinent test results  Airway Mallampati: II  TM Distance: >3 FB Neck ROM: full    Dental no notable dental hx.    Pulmonary    Pulmonary exam normal breath sounds clear to auscultation       Cardiovascular hypertension, Normal cardiovascular exam Rhythm:regular Rate:Normal     Neuro/Psych PSYCHIATRIC DISORDERS    GI/Hepatic   Endo/Other  Hypothyroidism Morbid obesity  Renal/GU      Musculoskeletal   Abdominal   Peds  Hematology   Anesthesia Other Findings   Reproductive/Obstetrics                             Anesthesia Physical Anesthesia Plan  ASA: III  Anesthesia Plan: General   Post-op Pain Management:    Induction: Intravenous  PONV Risk Score and Plan: 3 and Treatment may vary due to age or medical condition, TIVA and Propofol infusion  Airway Management Planned: Natural Airway  Additional Equipment:   Intra-op Plan:   Post-operative Plan:   Informed Consent: I have reviewed the patients History and Physical, chart, labs and discussed the procedure including the risks, benefits and alternatives for the proposed anesthesia with the patient or authorized representative who has indicated his/her understanding and acceptance.     Dental Advisory Given  Plan Discussed with: CRNA  Anesthesia Plan Comments:         Anesthesia Quick Evaluation

## 2020-09-20 NOTE — Anesthesia Postprocedure Evaluation (Signed)
Anesthesia Post Note  Patient: Laura Wilkinson  Procedure(s) Performed: COLONOSCOPY WITH PROPOFOL (N/A Rectum)     Patient location during evaluation: PACU Anesthesia Type: General Level of consciousness: awake and alert and oriented Pain management: satisfactory to patient Vital Signs Assessment: post-procedure vital signs reviewed and stable Respiratory status: spontaneous breathing, nonlabored ventilation and respiratory function stable Cardiovascular status: blood pressure returned to baseline and stable Postop Assessment: Adequate PO intake and No signs of nausea or vomiting Anesthetic complications: no   No complications documented.  Cherly [place]

## 2020-09-20 NOTE — Transfer of Care (Signed)
Immediate Anesthesia Transfer of Care Note  Patient: Laura Wilkinson  Procedure(s) Performed: COLONOSCOPY WITH PROPOFOL (N/A Rectum)  Patient Location: PACU  Anesthesia Type: General  Level of Consciousness: awake, alert  and patient cooperative  Airway and Oxygen Therapy: Patient Spontanous Breathing and Patient connected to supplemental oxygen  Post-op Assessment: Post-op Vital signs reviewed, Patient's Cardiovascular Status Stable, Respiratory Function Stable, Patent Airway and No signs of Nausea or vomiting  Post-op Vital Signs: Reviewed and stable  Complications: No complications documented.

## 2020-09-30 ENCOUNTER — Ambulatory Visit: Payer: Self-pay | Admitting: *Deleted

## 2020-09-30 ENCOUNTER — Encounter: Payer: Self-pay | Admitting: Physician Assistant

## 2020-09-30 NOTE — Telephone Encounter (Signed)
C/o swelling in left foot to toes and warm to touch. Denies fever. Can walk on foot but pain is described as discomfort. Unable to describe pain. Pain and swelling in left foot started yesterday . Elevated foot and some relief noted. appt scheduled for 10/04/20. No earlier appt noted. Care advise given.. Patient verbalized understanding of care advise and to call back or go to ED if symptoms worsen.   Reason for Disposition . [1] Swollen foot AND [2] no fever  (Exceptions: localized bump from bunions, calluses, insect bite, sting)  Answer Assessment - Initial Assessment Questions 1. ONSET: "When did the pain start?"      Yesterday  2. LOCATION: "Where is the pain located?"      Left foot close to toes 3. PAIN: "How bad is the pain?"    (Scale 1-10; or mild, moderate, severe)  - MILD (1-3): doesn't interfere with normal activities.   - MODERATE (4-7): interferes with normal activities (e.g., work or school) or awakens from sleep, limping.   - SEVERE (8-10): excruciating pain, unable to do any normal activities, unable to walk.      Mild  4. WORK OR EXERCISE: "Has there been any recent work or exercise that involved this part of the body?"      Some extra standing  5. CAUSE: "What do you think is causing the foot pain?"     Not sure  6. OTHER SYMPTOMS: "Do you have any other symptoms?" (e.g., leg pain, rash, fever, numbness)    Foot warm to touch ,, discomfort in foot  7. PREGNANCY: "Is there any chance you are pregnant?" "When was your last menstrual period?"     na  Protocols used: FOOT PAIN-A-AH

## 2020-10-04 ENCOUNTER — Ambulatory Visit: Payer: Self-pay | Admitting: Physician Assistant

## 2020-10-21 ENCOUNTER — Other Ambulatory Visit: Payer: Self-pay | Admitting: Family Medicine

## 2020-11-01 ENCOUNTER — Ambulatory Visit: Payer: Self-pay | Admitting: Family Medicine

## 2020-11-11 ENCOUNTER — Encounter: Payer: Self-pay | Admitting: Physician Assistant

## 2020-11-19 ENCOUNTER — Ambulatory Visit: Payer: BC Managed Care – PPO | Admitting: Family Medicine

## 2020-11-19 ENCOUNTER — Encounter: Payer: Self-pay | Admitting: Family Medicine

## 2020-11-19 ENCOUNTER — Other Ambulatory Visit: Payer: Self-pay

## 2020-11-19 VITALS — BP 128/82 | HR 79 | Temp 98.4°F | Wt 235.0 lb

## 2020-11-19 DIAGNOSIS — E890 Postprocedural hypothyroidism: Secondary | ICD-10-CM

## 2020-11-19 DIAGNOSIS — I10 Essential (primary) hypertension: Secondary | ICD-10-CM

## 2020-11-19 DIAGNOSIS — E611 Iron deficiency: Secondary | ICD-10-CM

## 2020-11-19 DIAGNOSIS — E785 Hyperlipidemia, unspecified: Secondary | ICD-10-CM | POA: Diagnosis not present

## 2020-11-19 DIAGNOSIS — Z6841 Body Mass Index (BMI) 40.0 and over, adult: Secondary | ICD-10-CM

## 2020-11-19 NOTE — Progress Notes (Signed)
Established patient visit   Patient: Laura Wilkinson   DOB: 09-25-1970   50 y.o. Female  MRN: [id number] Visit Date: 11/19/2020  Today's healthcare provider: Dortha Kern, PA-C   No chief complaint on file.  Subjective    HPI  Patient is a 50 year old female who presents today to discuss getting assistance with weight loss.  She has tried using Phentermine in the past but had side effects (visual).   She has been going to the gym for about a month now and has made changes to her diet.  She is eating more salad, sometimes replacing a meal with a protein shake, eating the largest meal of the day at lunch.  She has lost 5 pounds but states she has reached a plateau and hasn't been able to get the scale to move any more.  She would like to try Saxenda.    Past Medical History:  Diagnosis Date  . Allergy   . History of chicken pox   . Hyperlipidemia   . Migraine   . Social phobia   . Thyroid disease    Patient Active Problem List   Diagnosis Date Noted  . Special screening for malignant neoplasms, colon   . Primary hypertension 08/16/2020  . High serum ferritin 08/11/2019  . Post-menopausal 08/08/2019  . RBC microcytosis 02/17/2018  . S/P abdominal hysterectomy 02/16/2017  . Abnormal finding on radiology exam 10/23/2015  . Allergic rhinitis 10/23/2015  . Back pain 10/23/2015  . Fasciculation 10/23/2015  . Migraine headache 10/23/2015  . Hypothyroidism, postop 10/23/2015  . Seasonal affective disorder (HCC) 10/23/2015  . HLD (hyperlipidemia) 07/11/2009  . Social phobia 09/29/2005  . Class 3 severe obesity due to excess calories with serious comorbidity and body mass index (BMI) of 40.0 to 44.9 in adult [facility]) 07/13/1998   Past Surgical History:  Procedure Laterality Date  . 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 COLONOSCOPY WITH PROPOFOL N/A 09/20/2020   Procedure: COLONOSCOPY WITH PROPOFOL;  Surgeon: Midge Minium, MD;  Location:  [facility];  Service: Endoscopy;  Laterality: N/A;  . THYROIDECTOMY  04/20/2014   Total. Done by Dr. Anda Kraft   Family History  Problem Relation Age of Onset  . Hyperlipidemia Mother   . Hypertension Mother   . Hyperlipidemia Father   . Hypertension Father   . Cancer Neg Hx   . Diabetes Neg Hx   . Heart attack Neg Hx   . Breast cancer Neg Hx    Social History   Tobacco Use  . Smoking status: Never Smoker  . Smokeless tobacco: Never Used  Vaping Use  . Vaping Use: Never used  Substance Use Topics  . Alcohol use: No    Alcohol/week: 0.0 standard drinks  . Drug use: No   Allergies  Allergen Reactions  . Phentermine Hcl     Other reaction(s): Headache Blurry Vision     Medications: Outpatient Medications Prior to Visit  Medication Sig  . amLODipine (NORVASC) 10 MG tablet Take 1 tablet (10 mg total) by mouth daily.  . Calcium Carb-Cholecalciferol (CALCIUM 500+D PO) Take 2 tablets by mouth 2 (two) times daily.  . Ferrous Sulfate (IRON PO) Take by mouth.  . levothyroxine (SYNTHROID) 125 MCG tablet Take 1 tablet by mouth once daily  . lovastatin (MEVACOR) 10 MG tablet Take 1 tablet by mouth once daily  . MAGNESIUM PO Take by mouth daily.  . Multiple Vitamin (MULTIVITAMIN) tablet  Take 1 tablet by mouth daily.  . OMEGA-3 FATTY ACIDS PO Take 2 capsules by mouth daily.  Marland Kitchen spironolactone (ALDACTONE) 50 MG tablet Take 100 mg by mouth daily.   No facility-administered medications prior to visit.    Review of Systems  Respiratory: Negative for shortness of breath.   Cardiovascular: Negative for chest pain.  Gastrointestinal: Negative for abdominal pain.        Objective    BP 128/82 (BP Location: Right Arm, Patient Position: Sitting, Cuff Size: Normal)   Pulse 79   Temp 98.4 F (36.9 C) (Oral)   Wt 235 lb (106.6 kg)   SpO2 97%   BMI 44.40 kg/m   Wt Readings from Last 3 Encounters:  11/19/20 235 lb (106.6 kg)  09/20/20 232 lb (105.2 kg)  08/16/20 234 lb  (106.1 kg)    Physical Exam Constitutional:      General: She is not in acute distress.    Appearance: She is well-developed.  HENT:     Head: Normocephalic and atraumatic.     Right Ear: Hearing normal.     Left Ear: Hearing normal.     Nose: Nose normal.  Eyes:     General: Lids are normal. No scleral icterus.       Right eye: No discharge.        Left eye: No discharge.     Conjunctiva/sclera: Conjunctivae normal.  Neck:     Comments: History of total thyroidectomy. Cardiovascular:     Rate and Rhythm: Normal rate and regular rhythm.     Pulses: Normal pulses.     Heart sounds: Normal heart sounds.  Pulmonary:     Effort: Pulmonary effort is normal. No respiratory distress.     Breath sounds: Normal breath sounds.  Abdominal:     General: Bowel sounds are normal.     Palpations: Abdomen is soft.  Musculoskeletal:        General: Normal range of motion.     Cervical back: Normal range of motion and neck supple.  Skin:    Findings: No lesion or rash.  Neurological:     Mental Status: She is alert and oriented to person, place, and time.     Deep Tendon Reflexes: Reflexes normal.  Psychiatric:        Speech: Speech normal.        Behavior: Behavior normal.        Thought Content: Thought content normal.        Assessment & Plan     1. Class 3 severe obesity due to excess calories with serious comorbidity and body mass index (BMI) of 40.0 to 44.9 in adult [facility]) Has tried diet and exercise with minimal weight loss. Recheck labs and may need to consider bariatric or nutritional evaluation. Does not want to use Saxenda at the present and no help from Phentermine in the past. - CBC with Differential/Platelet - Comprehensive metabolic panel - Hemoglobin A1c - TSH  2. 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 Presently on Synthroid 125 mcg qd. Recheck labs. - CBC with Differential/Platelet - Comprehensive metabolic panel -  T4 - TSH  3. Primary hypertension Stable on the Amlodipine 10 mg qd with Spironolactone 50 mg qd prn. Recheck labs. - CBC with Differential/Platelet - Comprehensive metabolic panel - Lipid panel - TSH  4. Hyperlipidemia, unspecified hyperlipidemia type Tolerating Lovastatin 10 mg qd without side effects. Recheck labs. - CBC with Differential/Platelet - Comprehensive metabolic panel -  Lipid panel - TSH  5. Iron deficiency Still taking iron supplement and multivitamin. Recheck labs. - Ferritin - CBC with Differential/Platelet    No follow-ups on file.      I, Esmond Hinch, PA-C, have reviewed all documentation for this visit. The documentation on 11/19/20 for the exam, diagnosis, procedures, and orders are all accurate and complete.    Dortha Kern, PA-C  [employer] [phone] (phone) [phone] (fax)  [facility]

## 2020-11-21 ENCOUNTER — Ambulatory Visit: Payer: BC Managed Care – PPO | Admitting: Family Medicine

## 2020-11-23 LAB — CBC WITH DIFFERENTIAL/PLATELET
Basophils Absolute: 0.1 10*3/uL (ref 0.0–0.2)
Basos: 1 %
EOS (ABSOLUTE): 0.2 10*3/uL (ref 0.0–0.4)
Eos: 3 %
Hematocrit: 40.6 % (ref 34.0–46.6)
Hemoglobin: 12.4 g/dL (ref 11.1–15.9)
Immature Grans (Abs): 0 10*3/uL (ref 0.0–0.1)
Immature Granulocytes: 0 %
Lymphocytes Absolute: 2.4 10*3/uL (ref 0.7–3.1)
Lymphs: 39 %
MCH: 21.2 pg — ABNORMAL LOW (ref 26.6–33.0)
MCHC: 30.5 g/dL — ABNORMAL LOW (ref 31.5–35.7)
MCV: 69 fL — ABNORMAL LOW (ref 79–97)
Monocytes Absolute: 0.6 10*3/uL (ref 0.1–0.9)
Monocytes: 10 %
Neutrophils Absolute: 2.9 10*3/uL (ref 1.4–7.0)
Neutrophils: 47 %
Platelets: 400 10*3/uL (ref 150–450)
RBC: 5.85 x10E6/uL — ABNORMAL HIGH (ref 3.77–5.28)
RDW: 16.6 % — ABNORMAL HIGH (ref 11.7–15.4)
WBC: 6.1 10*3/uL (ref 3.4–10.8)

## 2020-11-23 LAB — FERRITIN: Ferritin: 176 ng/mL — ABNORMAL HIGH (ref 15–150)

## 2020-11-23 LAB — COMPREHENSIVE METABOLIC PANEL
ALT: 18 IU/L (ref 0–32)
AST: 13 IU/L (ref 0–40)
Albumin/Globulin Ratio: 1.7 (ref 1.2–2.2)
Albumin: 4.7 g/dL (ref 3.8–4.8)
Alkaline Phosphatase: 60 IU/L (ref 44–121)
BUN/Creatinine Ratio: 16 (ref 9–23)
BUN: 14 mg/dL (ref 6–24)
Bilirubin Total: 0.3 mg/dL (ref 0.0–1.2)
CO2: 21 mmol/L (ref 20–29)
Calcium: 9.6 mg/dL (ref 8.7–10.2)
Chloride: 105 mmol/L (ref 96–106)
Creatinine, Ser: 0.85 mg/dL (ref 0.57–1.00)
Globulin, Total: 2.8 g/dL (ref 1.5–4.5)
Glucose: 96 mg/dL (ref 65–99)
Potassium: 5 mmol/L (ref 3.5–5.2)
Sodium: 144 mmol/L (ref 134–144)
Total Protein: 7.5 g/dL (ref 6.0–8.5)
eGFR: 83 mL/min/{1.73_m2} (ref >59)

## 2020-11-23 LAB — HEMOGLOBIN A1C
Est. average glucose Bld gHb Est-mCnc: 111 mg/dL
Hgb A1c MFr Bld: 5.5 % (ref 4.8–5.6)

## 2020-11-23 LAB — LIPID PANEL
Chol/HDL Ratio: 4.5 ratio — ABNORMAL HIGH (ref 0.0–4.4)
Cholesterol, Total: 227 mg/dL — ABNORMAL HIGH (ref 100–199)
HDL: 50 mg/dL (ref >39)
LDL Chol Calc (NIH): 149 mg/dL — ABNORMAL HIGH (ref 0–99)
Triglycerides: 153 mg/dL — ABNORMAL HIGH (ref 0–149)
VLDL Cholesterol Cal: 28 mg/dL (ref 5–40)

## 2020-11-23 LAB — TSH: TSH: 0.48 u[IU]/mL (ref 0.450–4.500)

## 2020-11-23 LAB — T4: T4, Total: 11.4 ug/dL (ref 4.5–12.0)

## 2020-11-27 ENCOUNTER — Encounter: Payer: Self-pay | Admitting: Family Medicine

## 2020-11-27 NOTE — Telephone Encounter (Signed)
Accidentally routed to me.

## 2020-12-03 ENCOUNTER — Other Ambulatory Visit: Payer: Self-pay | Admitting: Family Medicine

## 2020-12-03 DIAGNOSIS — E611 Iron deficiency: Secondary | ICD-10-CM

## 2020-12-03 DIAGNOSIS — D751 Secondary polycythemia: Secondary | ICD-10-CM

## 2020-12-20 ENCOUNTER — Inpatient Hospital Stay: Payer: BC Managed Care – PPO

## 2020-12-20 ENCOUNTER — Inpatient Hospital Stay: Payer: BC Managed Care – PPO | Attending: Internal Medicine | Admitting: Internal Medicine

## 2020-12-20 ENCOUNTER — Other Ambulatory Visit: Payer: Self-pay

## 2020-12-20 VITALS — BP 134/93 | HR 67 | Temp 98.0°F | Wt 235.8 lb

## 2020-12-20 DIAGNOSIS — R718 Other abnormality of red blood cells: Secondary | ICD-10-CM

## 2020-12-20 DIAGNOSIS — E785 Hyperlipidemia, unspecified: Secondary | ICD-10-CM | POA: Insufficient documentation

## 2020-12-20 DIAGNOSIS — Z8249 Family history of ischemic heart disease and other diseases of the circulatory system: Secondary | ICD-10-CM | POA: Insufficient documentation

## 2020-12-20 DIAGNOSIS — Z90710 Acquired absence of both cervix and uterus: Secondary | ICD-10-CM | POA: Diagnosis not present

## 2020-12-20 DIAGNOSIS — Z79899 Other long term (current) drug therapy: Secondary | ICD-10-CM | POA: Diagnosis not present

## 2020-12-20 DIAGNOSIS — Z8349 Family history of other endocrine, nutritional and metabolic diseases: Secondary | ICD-10-CM | POA: Insufficient documentation

## 2020-12-20 DIAGNOSIS — E079 Disorder of thyroid, unspecified: Secondary | ICD-10-CM | POA: Insufficient documentation

## 2020-12-20 NOTE — Progress Notes (Signed)
[facility] CONSULT NOTE  Patient Care Team: Reine Just as PCP - General (Family Medicine)  CHIEF COMPLAINTS/PURPOSE OF CONSULTATION: Microcytosis   HEMATOLOGY HISTORY: Since 2015- Chronic microcytosis MCV 66-69; no anemia    HISTORY OF PRESENTING ILLNESS:  Laura Wilkinson 50 y.o.  female has been referred to [place] for further evaluation/work-up for microcytosis.  Patient denies any blood in stools or black-colored stools but denies any nausea vomiting.  Denies any fatigue.    Review of Systems  Constitutional:  Negative for chills, diaphoresis, fever, malaise/fatigue and weight loss.  HENT:  Negative for nosebleeds and sore throat.   Eyes:  Negative for double vision.  Respiratory:  Negative for cough, hemoptysis, sputum production, shortness of breath and wheezing.   Cardiovascular:  Negative for chest pain, palpitations, orthopnea and leg swelling.  Gastrointestinal:  Negative for abdominal pain, blood in stool, constipation, diarrhea, heartburn, melena, nausea and vomiting.  Genitourinary:  Negative for dysuria, frequency and urgency.  Musculoskeletal:  Negative for back pain and joint pain.  Skin: Negative.  Negative for itching and rash.  Neurological:  Negative for dizziness, tingling, focal weakness, weakness and headaches.  Endo/Heme/Allergies:  Does not bruise/bleed easily.  Psychiatric/Behavioral:  Negative for depression. The patient is not nervous/anxious and does not have insomnia.    MEDICAL HISTORY:  Past Medical History:  Diagnosis Date  . Allergy   . History of chicken pox   . Hyperlipidemia   . Migraine   . Social phobia   . Thyroid disease     SURGICAL HISTORY: Past Surgical History:  Procedure Laterality Date  . 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 COLONOSCOPY WITH PROPOFOL N/A 09/20/2020   Procedure: COLONOSCOPY WITH PROPOFOL;  Surgeon: Midge Minium, MD;  Location: [facility];   Service: Endoscopy;  Laterality: N/A;  . THYROIDECTOMY  04/20/2014   Total. Done by Dr. Anda Kraft    SOCIAL HISTORY: Social History   Socioeconomic History  . Marital status: Married    Spouse name: Not on file  . Number of children: Not on file  . Years of education: Not on file  . Highest education level: Not on file  Occupational History    Comment: Chiropodist associate  Tobacco Use  . Smoking status: Never  . Smokeless tobacco: Never  Vaping Use  . Vaping Use: Never used  Substance and Sexual Activity  . Alcohol use: No    Alcohol/week: 0.0 standard drinks  . Drug use: No  . Sexual activity: Not on file  Other Topics Concern  . Not on file  Social History Narrative  . Not on file   Social Determinants of Health   Financial Resource Strain: Not on file  Food Insecurity: Not on file  Transportation Needs: Not on file  Physical Activity: Not on file  Stress: Not on file  Social Connections: Not on file  Intimate Partner Violence: Not on file    FAMILY HISTORY: Family History  Problem Relation Age of Onset  . Hyperlipidemia Mother   . Hypertension Mother   . Hyperlipidemia Father   . Hypertension Father   . Cancer Neg Hx   . Diabetes Neg Hx   . Heart attack Neg Hx   . Breast cancer Neg Hx     ALLERGIES:  is allergic to phentermine hcl.  MEDICATIONS:  Current Outpatient Medications  Medication Sig Dispense Refill  . amLODipine (NORVASC) 10 MG tablet Take 1 tablet (  10 mg total) by mouth daily. 30 tablet 3  . Calcium Carb-Cholecalciferol (CALCIUM 500+D PO) Take 2 tablets by mouth 2 (two) times daily.    Marland Kitchen levothyroxine (SYNTHROID) 125 MCG tablet Take 1 tablet by mouth once daily 90 tablet 3  . levothyroxine (SYNTHROID) 125 MCG tablet Take 1 tablet by mouth daily.    Marland Kitchen lovastatin (MEVACOR) 10 MG tablet Take 1 tablet by mouth once daily 90 tablet 3  . MAGNESIUM PO Take by mouth daily.    . Multiple Vitamin (MULTIVITAMIN) tablet Take 1 tablet  by mouth daily.    . OMEGA-3 FATTY ACIDS PO Take 2 capsules by mouth daily.    Marland Kitchen spironolactone (ALDACTONE) 50 MG tablet Take 100 mg by mouth daily.    Marland Kitchen triamcinolone cream (KENALOG) 0.1 % Apply topically daily as needed.    . Ferrous Sulfate (IRON PO) Take by mouth. (Patient not taking: Reported on 12/20/2020)    . Multiple Minerals-Vitamins (BONE ESSENTIALS) CAPS Take by mouth. (Patient not taking: Reported on 12/20/2020)    . polyethylene glycol-electrolytes (NULYTELY) 420 g solution Take 4,000 mLs by mouth once. (Patient not taking: Reported on 12/20/2020)     No current facility-administered medications for this visit.      PHYSICAL EXAMINATION:   Vitals:   12/20/20 1357  BP: (!) 134/93  Pulse: 67  Temp: 98 F (36.7 C)  SpO2: 100%   Filed Weights   12/20/20 1357  Weight: 235 lb 12.8 oz (107 kg)    Physical Exam Constitutional:      Comments: Ambulating independently/assistive devices.  Alone/family  HENT:     Head: Normocephalic and atraumatic.     Mouth/Throat:     Pharynx: No oropharyngeal exudate.  Eyes:     Pupils: Pupils are equal, round, and reactive to light.  Cardiovascular:     Rate and Rhythm: Normal rate and regular rhythm.  Pulmonary:     Effort: No respiratory distress.     Breath sounds: No wheezing.     Comments: Decreased breath sounds bilaterally at bases.  No wheeze or crackles Abdominal:     General: Bowel sounds are normal. There is no distension.     Palpations: Abdomen is soft. There is no mass.     Tenderness: no abdominal tenderness There is no guarding or rebound.  Musculoskeletal:        General: No tenderness. Normal range of motion.     Cervical back: Normal range of motion and neck supple.  Skin:    General: Skin is warm.  Neurological:     Mental Status: She is alert and oriented to person, place, and time.  Psychiatric:        Mood and Affect: Affect normal.   LABORATORY DATA:  I have reviewed the data as listed Lab Results   Component Value Date   WBC 6.1 11/22/2020   HGB 12.4 11/22/2020   HCT 40.6 11/22/2020   MCV 69 (L) 11/22/2020   PLT 400 11/22/2020   Recent Labs    08/16/20 0843 11/22/20 0811  NA 139 144  K 4.7 5.0  CL 100 105  CO2 21 21  GLUCOSE 88 96  BUN 11 14  CREATININE 0.97 0.85  CALCIUM 9.4 9.6  GFRNONAA 68  --   GFRAA 79  --   PROT 7.4 7.5  ALBUMIN 4.9* 4.7  AST 23 13  ALT 31 18  ALKPHOS 62 60  BILITOT 0.3 0.3     No results found.  Microcytosis #Microcytosis without anemia-patient is asymptomatic.  Suspect thalassemia; rather than iron deficiency.  Review of labs suggest microcytosis without anemia dating back to 2015.  Which is reassuring.  # Check CBC/iron studies; hemoglobin electrophoresis.  Do not suspect any malignant process.  Thank you Ms.Burnette, PA-C for allowing me to participate in the care of your pleasant patient. Please do not hesitate to contact me with questions or concerns in the interim.   # DISPOSITION: # labs today-ordered # follow up TBD- Dr.B  Addendum: on 6/16-spoke to patient regarding results of the hemoglobin electrophoresis-normal.  Clinically suspect alpha thalassemia.  As patient is asymptomatic would not recommend any further work-up at this time.  Recommend follow-up with PCP follow-up with [place] as needed.  Patient agreement.     All questions were answered. The patient knows to call the clinic with any problems, questions or concerns.      Earna Coder, MD 12/26/2020 9:07 PM

## 2020-12-20 NOTE — Assessment & Plan Note (Addendum)
#  Microcytosis without anemia-patient is asymptomatic.  Suspect thalassemia; rather than iron deficiency.  Review of labs suggest microcytosis without anemia dating back to 2015.  Which is reassuring.  # Check CBC/iron studies; hemoglobin electrophoresis.  Do not suspect any malignant process.  Thank you Ms.Burnette, PA-C for allowing me to participate in the care of your pleasant patient. Please do not hesitate to contact me with questions or concerns in the interim.   # DISPOSITION: # labs today-ordered # follow up TBD- Dr.B  Addendum: on 6/16-spoke to patient regarding results of the hemoglobin electrophoresis-normal.  Clinically suspect alpha thalassemia.  As patient is asymptomatic would not recommend any further work-up at this time.  Recommend follow-up with PCP follow-up with [place] as needed.  Patient agreement.

## 2020-12-23 LAB — HGB FRACTIONATION CASCADE
Hgb A2: 2.1 % (ref 1.8–3.2)
Hgb A: 97.9 % (ref 96.4–98.8)
Hgb F: 0 % (ref 0.0–2.0)
Hgb S: 0 %

## 2020-12-26 ENCOUNTER — Telehealth: Payer: Self-pay | Admitting: Internal Medicine

## 2020-12-26 NOTE — Telephone Encounter (Signed)
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  Not recommend any further work-up/follow-up.  Recommend follow-up with PCP

## 2021-01-07 ENCOUNTER — Other Ambulatory Visit: Payer: Self-pay

## 2021-01-07 ENCOUNTER — Encounter: Payer: Self-pay | Admitting: Family Medicine

## 2021-01-07 ENCOUNTER — Ambulatory Visit: Payer: BC Managed Care – PPO | Admitting: Family Medicine

## 2021-01-07 VITALS — BP 114/76 | HR 64 | Temp 97.3°F | Resp 16 | Ht 61.0 in | Wt 234.0 lb

## 2021-01-07 DIAGNOSIS — Z87898 Personal history of other specified conditions: Secondary | ICD-10-CM

## 2021-01-07 DIAGNOSIS — I10 Essential (primary) hypertension: Secondary | ICD-10-CM | POA: Diagnosis not present

## 2021-01-07 NOTE — Progress Notes (Signed)
I,April Miller,acting as a Neurosurgeon for [employer], PA-C.,have documented all relevant documentation on the behalf of Dortha Kern, PA-C,as directed by  [employer], PA-C while in the presence of [employer], PA-C.   Established patient visit   Patient: Laura Wilkinson   DOB: 1970-10-06   50 y.o. Female  MRN: [id number] Visit Date: 01/07/2021  Today's healthcare provider: Dortha Kern, PA-C   No chief complaint on file.  Subjective    HPI  Patient is here to discuss medication amlodipine 10 mg. Patient states she has been having swelling in her arms, legs, feet. Patient states she has stopped taking the medications. Since then swelling has decreased. Patient has continued to monitor her blood pressure. 124/80, 134/80, 123/85.  Past Medical History:  Diagnosis Date   Allergy    History of chicken pox    Hyperlipidemia    Migraine    Social phobia    Thyroid disease    Past Surgical History:  Procedure Laterality Date   ABDOMINAL HYSTERECTOMY  2007   Done by Dr. Feliberto Gottron for leiomyomas   COLONOSCOPY WITH PROPOFOL N/A 09/20/2020   Procedure: COLONOSCOPY WITH PROPOFOL;  Surgeon: Midge Minium, MD;  Location: [facility];  Service: Endoscopy;  Laterality: N/A;   THYROIDECTOMY  04/20/2014   Total. Done by Dr. Anda Kraft   Social History   Tobacco Use   Smoking status: Never   Smokeless tobacco: Never  Vaping Use   Vaping Use: Never used  Substance Use Topics   Alcohol use: No    Alcohol/week: 0.0 standard drinks   Drug use: No   Family Status  Relation Name Status   Mother  Alive   Father  Alive   Sister  Alive   Neg Hx  (Not Specified)   Allergies  Allergen Reactions   Phentermine Hcl     Other reaction(s): Headache Blurry Vision    Medications: Outpatient Medications Prior to Visit  Medication Sig   amLODipine (NORVASC) 10 MG tablet Take 1 tablet (10 mg total) by mouth daily.   Calcium Carb-Cholecalciferol (CALCIUM 500+D PO)  Take 2 tablets by mouth 2 (two) times daily.   Ferrous Sulfate (IRON PO) Take by mouth. (Patient not taking: Reported on 12/20/2020)   levothyroxine (SYNTHROID) 125 MCG tablet Take 1 tablet by mouth once daily   levothyroxine (SYNTHROID) 125 MCG tablet Take 1 tablet by mouth daily.   lovastatin (MEVACOR) 10 MG tablet Take 1 tablet by mouth once daily   MAGNESIUM PO Take by mouth daily.   Multiple Minerals-Vitamins (BONE ESSENTIALS) CAPS Take by mouth. (Patient not taking: Reported on 12/20/2020)   Multiple Vitamin (MULTIVITAMIN) tablet Take 1 tablet by mouth daily.   OMEGA-3 FATTY ACIDS PO Take 2 capsules by mouth daily.   polyethylene glycol-electrolytes (NULYTELY) 420 g solution Take 4,000 mLs by mouth once. (Patient not taking: Reported on 12/20/2020)   spironolactone (ALDACTONE) 50 MG tablet Take 100 mg by mouth daily.   triamcinolone cream (KENALOG) 0.1 % Apply topically daily as needed.   No facility-administered medications prior to visit.    Review of Systems  Constitutional:  Negative for appetite change, chills, fatigue and fever.  Respiratory:  Negative for chest tightness and shortness of breath.   Cardiovascular:  Positive for leg swelling. Negative for chest pain and palpitations.  Gastrointestinal:  Negative for abdominal pain, nausea and vomiting.  Neurological:  Negative for dizziness and weakness.      Objective    BP 114/76 (BP Location:  Right Arm, Patient Position: Sitting, Cuff Size: Large)   Pulse 64   Temp (!) 97.3 F (36.3 C) (Oral)   Resp 16   Ht 5\' 1"  (1.549 m)   Wt 234 lb (106.1 kg)   SpO2 97%   BMI 44.21 kg/m  BP Readings from Last 3 Encounters:  01/07/21 114/76  12/20/20 (!) 134/93  11/19/20 128/82   Wt Readings from Last 3 Encounters:  01/07/21 234 lb (106.1 kg)  12/20/20 235 lb 12.8 oz (107 kg)  11/19/20 235 lb (106.6 kg)    Physical Exam Constitutional:      General: She is not in acute distress.    Appearance: She is well-developed.   HENT:     Head: Normocephalic and atraumatic.     Right Ear: Hearing normal.     Left Ear: Hearing normal.     Nose: Nose normal.  Eyes:     General: Lids are normal. No scleral icterus.       Right eye: No discharge.        Left eye: No discharge.     Conjunctiva/sclera: Conjunctivae normal.  Cardiovascular:     Rate and Rhythm: Normal rate and regular rhythm.     Heart sounds: Normal heart sounds.  Pulmonary:     Effort: Pulmonary effort is normal. No respiratory distress.     Breath sounds: Normal breath sounds.  Abdominal:     General: Bowel sounds are normal.     Palpations: Abdomen is soft.  Musculoskeletal:        General: No swelling. Normal range of motion.  Skin:    Findings: No lesion or rash.  Neurological:     Mental Status: She is alert and oriented to person, place, and time.  Psychiatric:        Speech: Speech normal.        Behavior: Behavior normal.        Thought Content: Thought content normal.    No results found for any visits on 01/07/21.  Assessment & Plan     1. Primary hypertension Stable BP on the Amlodipine 10 mg qd with Spironolactone 50 mg qd.   2. History of peripheral edema Suspected secondary to Amlodipine daily and hypothyroidism on Synthroid 125 mcg qd. 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 01/09/21 (hematologist). Will follow up with him prn.   No follow-ups on file.      I, Janyla Biscoe, PA-C, have reviewed all documentation for this visit. The documentation on 01/07/21 for the exam, diagnosis, procedures, and orders are all accurate and complete.    01/09/21, PA-C  Dortha Kern [phone] (phone) [phone] (fax)  [facility]

## 2021-01-20 ENCOUNTER — Ambulatory Visit: Payer: Self-pay | Admitting: Family Medicine

## 2021-03-14 ENCOUNTER — Other Ambulatory Visit: Payer: Self-pay | Admitting: Family Medicine

## 2021-03-14 ENCOUNTER — Encounter: Payer: Self-pay | Admitting: Family Medicine

## 2021-03-14 DIAGNOSIS — Z6841 Body Mass Index (BMI) 40.0 and over, adult: Secondary | ICD-10-CM

## 2021-03-14 MED ORDER — SAXENDA 18 MG/3ML ~~LOC~~ SOPN: PEN_INJECTOR | SUBCUTANEOUS | 1 refills | Status: DC

## 2021-03-14 NOTE — Progress Notes (Signed)
BMI over 44 and labs essentially normal. Requesting Saxenda with weight loss program.

## 2021-03-19 ENCOUNTER — Other Ambulatory Visit: Payer: Self-pay

## 2021-03-19 DIAGNOSIS — Z6841 Body Mass Index (BMI) 40.0 and over, adult: Secondary | ICD-10-CM

## 2021-03-19 NOTE — Telephone Encounter (Signed)
Patient needs PA done for her Saxenda  Cover my meds Key: [id number]

## 2021-03-20 NOTE — Telephone Encounter (Signed)
PA for Saxenda done today. Outcome: Pending.

## 2021-03-25 MED ORDER — SAXENDA 18 MG/3ML ~~LOC~~ SOPN: PEN_INJECTOR | SUBCUTANEOUS | 1 refills | Status: DC

## 2021-03-25 NOTE — Telephone Encounter (Signed)
Pharmacy called and stated that the RX for Saxenda is for 1 pen and they come in a box of five 58ml pens / please send a new Rx / pharmacy stated they sent a fax as well / please advise

## 2021-04-30 ENCOUNTER — Other Ambulatory Visit: Payer: Self-pay | Admitting: Family Medicine

## 2021-04-30 ENCOUNTER — Telehealth: Payer: Self-pay

## 2021-04-30 DIAGNOSIS — Z1231 Encounter for screening mammogram for malignant neoplasm of breast: Secondary | ICD-10-CM

## 2021-04-30 NOTE — Telephone Encounter (Signed)
Patient wants to get a mammogram done but hasnt seen anyone lately but Maurine Minister. She needs an order placed for a mammogram at [facility].    Please let patient know if this can be done .

## 2021-04-30 NOTE — Telephone Encounter (Signed)
Patient advised.KW 

## 2021-07-11 ENCOUNTER — Other Ambulatory Visit: Payer: Self-pay

## 2021-07-11 ENCOUNTER — Ambulatory Visit
Admission: RE | Admit: 2021-07-11 | Discharge: 2021-07-11 | Disposition: A | Discharge status: Home / Self Care | Payer: BC Managed Care – PPO | Source: Ambulatory Visit | Attending: Family Medicine | Admitting: Family Medicine

## 2021-07-11 DIAGNOSIS — Z1231 Encounter for screening mammogram for malignant neoplasm of breast: Secondary | ICD-10-CM | POA: Diagnosis present

## 2021-09-30 ENCOUNTER — Other Ambulatory Visit: Payer: Self-pay

## 2021-09-30 ENCOUNTER — Telehealth: Payer: Self-pay | Admitting: Physician Assistant

## 2021-09-30 NOTE — Telephone Encounter (Signed)
[facility] faxed refill request for the following medications: ? ?Liraglutide -Weight Management (SAXENDA) 18 MG/3ML SOPN  ? ? ?Please advise. ? ?

## 2021-10-07 ENCOUNTER — Other Ambulatory Visit: Payer: Self-pay

## 2021-10-07 ENCOUNTER — Telehealth: Payer: Self-pay | Admitting: Physician Assistant

## 2021-10-07 NOTE — Telephone Encounter (Signed)
[facility] faxed refill request for the following medications: ? ?Liraglutide -Weight Management (SAXENDA) 18 MG/3ML SOPN  ? ? ?Please advise. ? ?

## 2021-11-18 ENCOUNTER — Other Ambulatory Visit: Payer: Self-pay | Admitting: Physician Assistant

## 2021-11-18 ENCOUNTER — Encounter: Payer: Self-pay | Admitting: Physician Assistant

## 2021-11-18 ENCOUNTER — Ambulatory Visit: Payer: BC Managed Care – PPO | Admitting: Physician Assistant

## 2021-11-18 VITALS — BP 147/95 | HR 70 | Temp 98.2°F | Ht 62.0 in | Wt 234.7 lb

## 2021-11-18 DIAGNOSIS — E039 Hypothyroidism, unspecified: Secondary | ICD-10-CM | POA: Diagnosis not present

## 2021-11-18 DIAGNOSIS — I10 Essential (primary) hypertension: Secondary | ICD-10-CM

## 2021-11-18 DIAGNOSIS — E7849 Other hyperlipidemia: Secondary | ICD-10-CM

## 2021-11-18 DIAGNOSIS — Z23 Encounter for immunization: Secondary | ICD-10-CM | POA: Diagnosis not present

## 2021-11-18 DIAGNOSIS — H9311 Tinnitus, right ear: Secondary | ICD-10-CM | POA: Diagnosis not present

## 2021-11-18 DIAGNOSIS — J301 Allergic rhinitis due to pollen: Secondary | ICD-10-CM

## 2021-11-18 DIAGNOSIS — R7989 Other specified abnormal findings of blood chemistry: Secondary | ICD-10-CM

## 2021-11-18 MED ORDER — FLUTICASONE PROPIONATE 50 MCG/ACT NA SUSP: 2.0000 | Freq: Every day | NASAL | 3 refills | Status: DC

## 2021-11-18 MED ORDER — LORATADINE 10 MG PO TABS: 10.0000 mg | ORAL_TABLET | Freq: Every day | ORAL | 1 refills | Status: DC

## 2021-11-18 MED ORDER — LOSARTAN POTASSIUM 25 MG PO TABS: 25.0000 mg | ORAL_TABLET | Freq: Every day | ORAL | 1 refills | Status: DC

## 2021-11-18 NOTE — Assessment & Plan Note (Signed)
Will treat as allergic rhinitis w/ serous fluid behind TM ?Advised pt if no improvement likely need ref to ENT ?

## 2021-11-18 NOTE — Assessment & Plan Note (Signed)
rx claritin daily ?rx flonase once daily to BID  ?

## 2021-11-18 NOTE — Assessment & Plan Note (Signed)
Currently on synthroid 125 mg daily ?Will recheck tsh/t4 ?

## 2021-11-18 NOTE — Assessment & Plan Note (Signed)
Discussed options like saxenda--wegovy, pt disliked injections ?May be good candidate for contrave, will get labs ?

## 2021-11-18 NOTE — Assessment & Plan Note (Signed)
Managed with lovastatin 10 mg ?Goal is LDL < 100 ?Fasting labs today ?

## 2021-11-18 NOTE — Assessment & Plan Note (Signed)
Developed swelling on amlodipine and d/c not replaced ?Taking spironolactone 50 mg BID for acne--advised she cant take this with losartan; pt states she wishes to d/c anyway.  ?Did offer alternatives, advised pt of concern of increase potassium when on spironolactone and losartan ?F/u 4-6 weeks, will check bmp then ?Labs today ?

## 2021-11-18 NOTE — Progress Notes (Signed)
?  ? ? ?Established patient visit ? ? ?Patient: Laura Wilkinson   DOB: [birth year]   51 y.o. Female  MRN: [id number] ?Visit Date: 11/18/2021 ? ?Today's healthcare provider: Mikey Kirschner, PA-C  ? ?Cc. Chronic follow up, additional concerns ? ?Subjective  ?  ?HPI  ?Patient would like to discuss weight loss medication ?--Previously was on saxenda for a few months, disliked injections, disliked side effects ?--Allergy to phentermine in chart--HA, blurry vision ? ?Ringing in right ear ?-Few weeks, when it is quiet it is louder.  ?-Denies hearing loss ?-Reports sinus congestion and seasonal allergies. ? ?Hypertension, follow-up ? ?BP Readings from Last 3 Encounters:  ?11/18/21 (!) 147/95  ?01/07/21 114/76  ?12/20/20 (!) 134/93  ? Wt Readings from Last 3 Encounters:  ?11/18/21 234 lb 11.2 oz (106.5 kg)  ?01/07/21 234 lb (106.1 kg)  ?12/20/20 235 lb 12.8 oz (107 kg)  ?  ? ?She was last seen for hypertension 11 months ago.  ?BP at that visit was 114/76. Management since that visit includes none. Continue amlodipine 10 mg with spironolactone 50 mg qd. ? ?She reports excellent compliance with treatment. ?She is not having side effects.  ?She is following a Low Sodium diet. ?She is exercising. ?She does not smoke. ? ?Use of agents associated with hypertension: none.  ? ?Outside blood pressures are 130 and higher/90s and higher. ?Symptoms: ?No chest pain No chest pressure  ?No palpitations No syncope  ?No dyspnea No orthopnea  ?No paroxysmal nocturnal dyspnea No lower extremity edema  ? ?Pertinent labs ?Lab Results  ?Component Value Date  ? CHOL 227 (H) 11/22/2020  ? HDL 50 11/22/2020  ? LDLCALC 149 (H) 11/22/2020  ? TRIG 153 (H) 11/22/2020  ? CHOLHDL 4.5 (H) 11/22/2020  ? Lab Results  ?Component Value Date  ? NA 144 11/22/2020  ? K 5.0 11/22/2020  ? CREATININE 0.85 11/22/2020  ? EGFR 83 11/22/2020  ? GLUCOSE 96 11/22/2020  ? TSH 0.480 11/22/2020  ?  ? ?The 10-year ASCVD risk score (Arnett DK, et al., 2019) is:  8.1% ? ?--------------------------------------------------------------------------------------------------- Lipid/Cholesterol, Follow-up ? ?Last lipid panel Other pertinent labs  ?Lab Results  ?Component Value Date  ? CHOL 227 (H) 11/22/2020  ? HDL 50 11/22/2020  ? LDLCALC 149 (H) 11/22/2020  ? TRIG 153 (H) 11/22/2020  ? CHOLHDL 4.5 (H) 11/22/2020  ? Lab Results  ?Component Value Date  ? ALT 18 11/22/2020  ? AST 13 11/22/2020  ? PLT 400 11/22/2020  ? TSH 0.480 11/22/2020  ?  ? ? ?Management since that visit includes lovastatin 10 mg daily.  ? ? ?She is not having side effects.  ? ?Symptoms: ?No chest pain No chest pressure/discomfort  ?No dyspnea No lower extremity edema  ?Yes numbness or tingling of extremity No orthopnea  ?No palpitations No paroxysmal nocturnal dyspnea  ?No speech difficulty No syncope  ? ?Current diet: low salt ?Current exercise:  treadmill, elliptical, walking and bicycle ? ?The 10-year ASCVD risk score (Arnett DK, et al., 2019) is: 8.1% ? ?--------------------------------------------------------------------------------------------------- ? ? ?Medications: ?Outpatient Medications Prior to Visit  ?Medication Sig  ? Calcium Carb-Cholecalciferol (CALCIUM 500+D PO) Take 2 tablets by mouth 2 (two) times daily.  ? Liraglutide -Weight Management (SAXENDA) 18 MG/3ML SOPN Inject subcutaneously 0.6 mg daily week 1, 1.2 mg daily week 2, 1.8 mg week 3 then 2.4 mg  week 4.  ? lovastatin (MEVACOR) 10 MG tablet Take 1 tablet by mouth once daily  ? MAGNESIUM PO Take by mouth daily.  ?  Multiple Vitamin (MULTIVITAMIN) tablet Take 1 tablet by mouth daily.  ? OMEGA-3 FATTY ACIDS PO Take 2 capsules by mouth daily.  ? triamcinolone cream (KENALOG) 0.1 % Apply topically daily as needed.  ? [DISCONTINUED] spironolactone (ALDACTONE) 50 MG tablet Take 100 mg by mouth daily.  ? levothyroxine (SYNTHROID) 125 MCG tablet Take 1 tablet by mouth once daily (Patient not taking: Reported on 11/18/2021)  ? [DISCONTINUED]  amLODipine (NORVASC) 10 MG tablet Take 1 tablet (10 mg total) by mouth daily. (Patient not taking: Reported on 11/18/2021)  ? [DISCONTINUED] Ferrous Sulfate (IRON PO) Take by mouth. (Patient not taking: Reported on 11/18/2021)  ? [DISCONTINUED] Multiple Minerals-Vitamins (BONE ESSENTIALS) CAPS Take by mouth. (Patient not taking: Reported on 11/18/2021)  ? [DISCONTINUED] polyethylene glycol-electrolytes (NULYTELY) 420 g solution Take 4,000 mLs by mouth once. (Patient not taking: Reported on 11/18/2021)  ? ?No facility-administered medications prior to visit.  ? ? ?Review of Systems  ?Constitutional:  Negative for fatigue and fever.  ?HENT:  Positive for tinnitus.   ?Respiratory:  Negative for cough and shortness of breath.   ?Cardiovascular:  Negative for chest pain and leg swelling.  ?Gastrointestinal:  Negative for abdominal pain.  ?Neurological:  Negative for dizziness and headaches.  ?  Objective  ?  ?Blood pressure (!) 147/95, pulse 70, temperature 98.2 ?F (36.8 ?C), temperature source Oral, height '5\' 2"'  (1.575 m), weight 234 lb 11.2 oz (106.5 kg), SpO2 98 %.  ? ?Physical Exam ?Constitutional:   ?   General: She is awake.  ?   Appearance: She is well-developed.  ?HENT:  ?   Head: Normocephalic.  ?   Right Ear: A middle ear effusion is present. Tympanic membrane is not injected, erythematous or bulging.  ?   Left Ear: A middle ear effusion is present. Tympanic membrane is not injected, erythematous or bulging.  ?Eyes:  ?   Conjunctiva/sclera: Conjunctivae normal.  ?Cardiovascular:  ?   Rate and Rhythm: Normal rate and regular rhythm.  ?   Heart sounds: Normal heart sounds.  ?Pulmonary:  ?   Effort: Pulmonary effort is normal.  ?   Breath sounds: Normal breath sounds.  ?Skin: ?   General: Skin is warm.  ?Neurological:  ?   Mental Status: She is alert and oriented to person, place, and time.  ?Psychiatric:     ?   Attention and Perception: Attention normal.     ?   Mood and Affect: Mood normal.     ?   Speech: Speech  normal.     ?   Behavior: Behavior is cooperative.  ? ?No results found for any visits on 11/18/21. ? Assessment & Plan  ?  ? ?Problem List Items Addressed This Visit   ? ?  ? Cardiovascular and Mediastinum  ? Primary hypertension - Primary  ?  Developed swelling on amlodipine and d/c not replaced ?Taking spironolactone 50 mg BID for acne--advised she cant take this with losartan; pt states she wishes to d/c anyway.  ?Did offer alternatives, advised pt of concern of increase potassium when on spironolactone and losartan ?F/u 4-6 weeks, will check bmp then ?Labs today ? ?  ?  ? Relevant Medications  ? losartan (COZAAR) 25 MG tablet  ? Other Relevant Orders  ? Comprehensive Metabolic Panel (CMET)  ?  ? Respiratory  ? Allergic rhinitis  ?  rx claritin daily ?rx flonase once daily to BID  ? ?  ?  ? Relevant Medications  ? fluticasone (FLONASE) 50  MCG/ACT nasal spray  ? loratadine (CLARITIN) 10 MG tablet  ?  ? Endocrine  ? Hypothyroidism  ?  Currently on synthroid 125 mg daily ?Will recheck tsh/t4 ? ?  ?  ? Relevant Orders  ? CBC w/Diff/Platelet  ? TSH + free T4  ?  ? Other  ? HLD (hyperlipidemia)  ?  Managed with lovastatin 10 mg ?Goal is LDL < 100 ?Fasting labs today ? ?  ?  ? Relevant Medications  ? losartan (COZAAR) 25 MG tablet  ? Other Relevant Orders  ? Lipid Profile  ? Obesity, morbid (more than 100 lbs over ideal weight or BMI > 40) (HCC)  ?  Discussed options like saxenda--wegovy, pt disliked injections ?May be good candidate for contrave, will get labs ? ?  ?  ? Tinnitus of right ear  ?  Will treat as allergic rhinitis w/ serous fluid behind TM ?Advised pt if no improvement likely need ref to ENT ? ?  ?  ? Relevant Medications  ? fluticasone (FLONASE) 50 MCG/ACT nasal spray  ? loratadine (CLARITIN) 10 MG tablet  ? ?Other Visit Diagnoses   ? ? Elevated ferritin      ? Relevant Orders  ? Iron, TIBC and Ferritin Panel  ? ?  ?  ? ?Return in about 6 weeks (around 12/30/2021) for hypertension.  ?   ? ?I, Mikey Kirschner, PA-C have reviewed all documentation for this visit. The documentation on 11/18/2021 for the exam, diagnosis, procedures, and orders are all accurate and complete. ?Mikey Kirschner, PA-C ?[place] ?[place]

## 2021-11-19 ENCOUNTER — Encounter: Payer: Self-pay | Admitting: Physician Assistant

## 2021-11-19 ENCOUNTER — Other Ambulatory Visit: Payer: Self-pay | Admitting: Physician Assistant

## 2021-11-19 DIAGNOSIS — E7849 Other hyperlipidemia: Secondary | ICD-10-CM

## 2021-11-19 LAB — CBC WITH DIFFERENTIAL/PLATELET
Basophils Absolute: 0.1 10*3/uL (ref 0.0–0.2)
Basos: 1 %
EOS (ABSOLUTE): 0.1 10*3/uL (ref 0.0–0.4)
Eos: 1 %
Hematocrit: 45.7 % (ref 34.0–46.6)
Hemoglobin: 14 g/dL (ref 11.1–15.9)
Immature Grans (Abs): 0 10*3/uL (ref 0.0–0.1)
Immature Granulocytes: 0 %
Lymphocytes Absolute: 2.4 10*3/uL (ref 0.7–3.1)
Lymphs: 38 %
MCH: 21 pg — ABNORMAL LOW (ref 26.6–33.0)
MCHC: 30.6 g/dL — ABNORMAL LOW (ref 31.5–35.7)
MCV: 68 fL — ABNORMAL LOW (ref 79–97)
Monocytes Absolute: 0.6 10*3/uL (ref 0.1–0.9)
Monocytes: 9 %
Neutrophils Absolute: 3.3 10*3/uL (ref 1.4–7.0)
Neutrophils: 51 %
Platelets: 438 10*3/uL (ref 150–450)
RBC: 6.68 x10E6/uL — ABNORMAL HIGH (ref 3.77–5.28)
RDW: 17.4 % — ABNORMAL HIGH (ref 11.7–15.4)
WBC: 6.4 10*3/uL (ref 3.4–10.8)

## 2021-11-19 LAB — COMPREHENSIVE METABOLIC PANEL WITH GFR
ALT: 22 IU/L (ref 0–32)
AST: 18 IU/L (ref 0–40)
Albumin/Globulin Ratio: 1.7 (ref 1.2–2.2)
Albumin: 4.7 g/dL (ref 3.8–4.9)
Alkaline Phosphatase: 71 IU/L (ref 44–121)
BUN/Creatinine Ratio: 11 (ref 9–23)
BUN: 11 mg/dL (ref 6–24)
Bilirubin Total: 0.4 mg/dL (ref 0.0–1.2)
CO2: 23 mmol/L (ref 20–29)
Calcium: 9.6 mg/dL (ref 8.7–10.2)
Chloride: 100 mmol/L (ref 96–106)
Creatinine, Ser: 0.98 mg/dL (ref 0.57–1.00)
Globulin, Total: 2.7 g/dL (ref 1.5–4.5)
Glucose: 88 mg/dL (ref 70–99)
Potassium: 4.5 mmol/L (ref 3.5–5.2)
Sodium: 140 mmol/L (ref 134–144)
Total Protein: 7.4 g/dL (ref 6.0–8.5)
eGFR: 70 mL/min/1.73

## 2021-11-19 LAB — TSH+FREE T4
Free T4: 1.5 ng/dL (ref 0.82–1.77)
TSH: 0.286 u[IU]/mL — ABNORMAL LOW (ref 0.450–4.500)

## 2021-11-19 LAB — IRON,TIBC AND FERRITIN PANEL
Ferritin: 159 ng/mL — ABNORMAL HIGH (ref 15–150)
Iron Saturation: 20 % (ref 15–55)
Iron: 69 ug/dL (ref 27–159)
Total Iron Binding Capacity: 343 ug/dL (ref 250–450)
UIBC: 274 ug/dL (ref 131–425)

## 2021-11-19 LAB — LIPID PANEL
Chol/HDL Ratio: 4.6 ratio — ABNORMAL HIGH (ref 0.0–4.4)
Cholesterol, Total: 231 mg/dL — ABNORMAL HIGH (ref 100–199)
HDL: 50 mg/dL
LDL Chol Calc (NIH): 147 mg/dL — ABNORMAL HIGH (ref 0–99)
Triglycerides: 190 mg/dL — ABNORMAL HIGH (ref 0–149)
VLDL Cholesterol Cal: 34 mg/dL (ref 5–40)

## 2021-11-19 MED ORDER — LOVASTATIN 20 MG PO TABS: 20.0000 mg | ORAL_TABLET | Freq: Every day | ORAL | 3 refills | Status: DC

## 2021-11-21 ENCOUNTER — Encounter: Payer: Self-pay | Admitting: Physician Assistant

## 2021-11-27 ENCOUNTER — Other Ambulatory Visit: Payer: Self-pay | Admitting: Physician Assistant

## 2021-11-27 MED ORDER — WEGOVY 0.25 MG/0.5ML ~~LOC~~ SOAJ: 0.2500 mg | SUBCUTANEOUS | 0 refills | Status: DC

## 2021-11-28 ENCOUNTER — Encounter: Payer: Self-pay | Admitting: Physician Assistant

## 2021-12-17 ENCOUNTER — Other Ambulatory Visit: Payer: Self-pay | Admitting: Physician Assistant

## 2021-12-18 NOTE — Telephone Encounter (Signed)
Received a refill request for initial dose of 0.25 mg Wegovy Per OV notes dose should go to 1.5 weekly for 4 weeks.   "[place]--   Looks like you were approved! Should be available at the pharmacy.    Once you pick it up --let me know so I can send in the next dose.    0.25 mg weekly for 4 weeks and then increase to 0.5 mg weekly for 4 weeks." Going to route current request back to office.  Last RF 11/27/21      Alfredia Ferguson, PA-C [facility] [address] [place], [place], [address] Office: [phone] Fax: [phone]

## 2021-12-19 ENCOUNTER — Other Ambulatory Visit: Payer: Self-pay | Admitting: Physician Assistant

## 2021-12-19 MED ORDER — WEGOVY 0.5 MG/0.5ML ~~LOC~~ SOAJ: 0.5000 mg | SUBCUTANEOUS | 1 refills | Status: DC

## 2022-01-05 ENCOUNTER — Telehealth: Payer: Self-pay | Admitting: Physician Assistant

## 2022-01-06 ENCOUNTER — Other Ambulatory Visit: Payer: Self-pay | Admitting: Physician Assistant

## 2022-01-06 DIAGNOSIS — E039 Hypothyroidism, unspecified: Secondary | ICD-10-CM

## 2022-01-06 MED ORDER — LEVOTHYROXINE SODIUM 125 MCG PO TABS: 125.0000 ug | ORAL_TABLET | Freq: Every day | ORAL | 3 refills | Status: DC

## 2022-01-08 NOTE — Progress Notes (Signed)
I,Sha'taria Tyson,acting as a Education administrator for [employer], PA-C.,have documented all relevant documentation on the behalf of Mikey Kirschner, PA-C,as directed by  Mikey Kirschner, PA-C while in the presence of Mikey Kirschner, PA-C.   Established patient visit   Patient: Laura Wilkinson   DOB: 25-Jul-1970   51 y.o. Female  MRN: [id number] Visit Date: 01/09/2022  Today's healthcare provider: Mikey Kirschner, PA-C   Cc. HTN, HLD  Subjective    HPI  Weight management --Currently on 0.5 mg of Wegovy, second shot. Reports no weight loss but significant change in appetite and eating smaller meals. Still working out, [employer]. Reports some acid reflux increase, especially at night since starting [facility] Denies nausea.  Hypertension, follow-up  BP Readings from Last 3 Encounters:  01/09/22 (!) 149/102  11/18/21 (!) 147/95  01/07/21 114/76   Wt Readings from Last 3 Encounters:  01/09/22 239 lb 9.6 oz (108.7 kg)  11/18/21 234 lb 11.2 oz (106.5 kg)  01/07/21 234 lb (106.1 kg)     She was last seen for hypertension 7 weeks ago.  BP at that visit was 147/95. Management since that visit includes begin losartan 25 mg, d/c amlodipine due to swelling.  She reports excellent compliance with treatment. She is not having side effects.  She is following a Low Sodium diet. She is exercising. She does not smoke.  Use of agents associated with hypertension: none.   Outside blood pressures are 130-140's/90s Symptoms: No chest pain No chest pressure  No palpitations No syncope  No dyspnea No orthopnea  No paroxysmal nocturnal dyspnea No lower extremity edema   Pertinent labs Lab Results  Component Value Date   CHOL 231 (H) 11/18/2021   HDL 50 11/18/2021   LDLCALC 147 (H) 11/18/2021   TRIG 190 (H) 11/18/2021   CHOLHDL 4.6 (H) 11/18/2021   Lab Results  Component Value Date   NA 140 11/18/2021   K 4.5 11/18/2021   CREATININE 0.98 11/18/2021   EGFR 70 11/18/2021   GLUCOSE 88  11/18/2021   TSH 0.286 (L) 11/18/2021     The 10-year ASCVD risk score (Arnett DK, et al., 2019) is: 8.6%  ---------------------------------------------------------------------------------------------------  Lipid/Cholesterol, Follow-up  Last lipid panel Other pertinent labs  Lab Results  Component Value Date   CHOL 231 (H) 11/18/2021   HDL 50 11/18/2021   LDLCALC 147 (H) 11/18/2021   TRIG 190 (H) 11/18/2021   CHOLHDL 4.6 (H) 11/18/2021   Lab Results  Component Value Date   ALT 22 11/18/2021   AST 18 11/18/2021   PLT 438 11/18/2021   TSH 0.286 (L) 11/18/2021      She reports excellent compliance with treatment. She is not having side effects. Muscle twitch  Symptoms: No chest pain No chest pressure/discomfort  No dyspnea No lower extremity edema  Yes numbness or tingling of extremity No orthopnea  No palpitations No paroxysmal nocturnal dyspnea  No speech difficulty No syncope   Current diet: low salt Current exercise: bicycling, walking, and treadmill and light weights  The 10-year ASCVD risk score (Arnett DK, et al., 2019) is: 8.6%  ---------------------------------------------------------------------------------------------------  Medications: Outpatient Medications Prior to Visit  Medication Sig   Calcium Carb-Cholecalciferol (CALCIUM 500+D PO) Take 2 tablets by mouth 2 (two) times daily.   fluticasone (FLONASE) 50 MCG/ACT nasal spray Place 2 sprays into both nostrils daily.   levothyroxine (SYNTHROID) 125 MCG tablet Take 1 tablet (125 mcg total) by mouth daily.   loratadine (CLARITIN) 10 MG tablet Take 1  tablet (10 mg total) by mouth daily.   lovastatin (MEVACOR) 20 MG tablet Take 1 tablet (20 mg total) by mouth at bedtime.   MAGNESIUM PO Take by mouth daily.   Multiple Vitamin (MULTIVITAMIN) tablet Take 1 tablet by mouth daily.   OMEGA-3 FATTY ACIDS PO Take 2 capsules by mouth daily.   Semaglutide-Weight Management (WEGOVY) 0.5 MG/0.5ML SOAJ Inject 0.5 mg  into the skin once a week.   triamcinolone cream (KENALOG) 0.1 % Apply topically daily as needed.   [DISCONTINUED] losartan (COZAAR) 25 MG tablet Take 1 tablet (25 mg total) by mouth daily.   No facility-administered medications prior to visit.    Review of Systems  Constitutional:  Negative for fatigue and fever.  Respiratory:  Negative for cough and shortness of breath.   Cardiovascular:  Negative for chest pain and leg swelling.  Gastrointestinal:  Negative for abdominal pain.       Reflux  Neurological:  Negative for dizziness and headaches.      Objective    Blood pressure (!) 149/102, pulse 80, temperature 98.3 F (36.8 C), temperature source Oral, height _0  (1.575 m), weight 239 lb 9.6 oz (108.7 kg), SpO2 100 %.   Physical Exam Constitutional:      General: She is awake.     Appearance: She is well-developed.  HENT:     Head: Normocephalic.  Eyes:     Conjunctiva/sclera: Conjunctivae normal.  Cardiovascular:     Rate and Rhythm: Normal rate and regular rhythm.     Heart sounds: Normal heart sounds.  Pulmonary:     Effort: Pulmonary effort is normal.     Breath sounds: Normal breath sounds.  Skin:    General: Skin is warm.  Neurological:     Mental Status: She is alert and oriented to person, place, and time.  Psychiatric:        Attention and Perception: Attention normal.        Mood and Affect: Mood normal.        Speech: Speech normal.        Behavior: Behavior is cooperative.     No results found for any visits on 01/09/22.  Assessment & Plan     Problem List Items Addressed This Visit       Cardiovascular and Mediastinum   Primary hypertension - Primary    Still significantly elevated in office. Advised to increase to losartan 50 mg. Continue monitoring at home F/u 6-8 weeks      Relevant Medications   losartan (COZAAR) 50 MG tablet   Other Relevant Orders   Basic Metabolic Panel (BMET)     Endocrine   Hypothyroidism    Will recheck  as TSH was abnormal last time. No dosing changes       Relevant Orders   TSH + free T4     Other   HLD (hyperlipidemia)    Previously increased lovastatin from 10 mg to 20 mg, LDL goal < 100 Will recheck fasting lipids      Relevant Medications   losartan (COZAAR) 50 MG tablet   Other Relevant Orders   Lipid Profile   Hepatic function panel   Obesity, morbid (more than 100 lbs over ideal weight or BMI > 40) (HCC)    Pt currently on Wegovy 0.5 mg, no weight loss as of yet but reports change in appetite, eating less. Continue 0.5 mg for a total of 8 weeks and then we can increase to 1 mg.  For associated acid reflux -- pepcid or tums prn         Return in about 6 weeks (around 02/20/2022) for hypertension.      I, Mikey Kirschner, PA-C have reviewed all documentation for this visit. The documentation on  01/09/2022  for the exam, diagnosis, procedures, and orders are all accurate and complete.  Mikey Kirschner, PA-C [facility] [address] [place], [place], [address] Office: [phone] Fax: [place]

## 2022-01-09 ENCOUNTER — Ambulatory Visit: Payer: BC Managed Care – PPO | Admitting: Physician Assistant

## 2022-01-09 ENCOUNTER — Encounter: Payer: Self-pay | Admitting: Physician Assistant

## 2022-01-09 VITALS — BP 149/102 | HR 80 | Temp 98.3°F | Ht 62.0 in | Wt 239.6 lb

## 2022-01-09 DIAGNOSIS — E7849 Other hyperlipidemia: Secondary | ICD-10-CM | POA: Diagnosis not present

## 2022-01-09 DIAGNOSIS — E039 Hypothyroidism, unspecified: Secondary | ICD-10-CM | POA: Diagnosis not present

## 2022-01-09 DIAGNOSIS — I10 Essential (primary) hypertension: Secondary | ICD-10-CM

## 2022-01-09 MED ORDER — LOSARTAN POTASSIUM 50 MG PO TABS: 50.0000 mg | ORAL_TABLET | Freq: Every day | ORAL | 1 refills | Status: DC

## 2022-01-09 NOTE — Assessment & Plan Note (Signed)
Will recheck as TSH was abnormal last time. No dosing changes

## 2022-01-09 NOTE — Assessment & Plan Note (Addendum)
Pt currently on Wegovy 0.5 mg, no weight loss as of yet but reports change in appetite, eating less. Continue 0.5 mg for a total of 8 weeks and then we can increase to 1 mg.  For associated acid reflux -- pepcid or tums prn

## 2022-01-09 NOTE — Assessment & Plan Note (Signed)
Previously increased lovastatin from 10 mg to 20 mg, LDL goal < 100 Will recheck fasting lipids

## 2022-01-09 NOTE — Assessment & Plan Note (Signed)
Still significantly elevated in office. Advised to increase to losartan 50 mg. Continue monitoring at home F/u 6-8 weeks

## 2022-01-10 LAB — LIPID PANEL
Chol/HDL Ratio: 4.2 ratio (ref 0.0–4.4)
Cholesterol, Total: 198 mg/dL (ref 100–199)
HDL: 47 mg/dL (ref >39)
LDL Chol Calc (NIH): 112 mg/dL — ABNORMAL HIGH (ref 0–99)
Triglycerides: 227 mg/dL — ABNORMAL HIGH (ref 0–149)
VLDL Cholesterol Cal: 39 mg/dL (ref 5–40)

## 2022-01-10 LAB — BASIC METABOLIC PANEL
BUN/Creatinine Ratio: 13 (ref 9–23)
BUN: 12 mg/dL (ref 6–24)
CO2: 23 mmol/L (ref 20–29)
Calcium: 9.4 mg/dL (ref 8.7–10.2)
Chloride: 105 mmol/L (ref 96–106)
Creatinine, Ser: 0.93 mg/dL (ref 0.57–1.00)
Glucose: 86 mg/dL (ref 70–99)
Potassium: 4.5 mmol/L (ref 3.5–5.2)
Sodium: 143 mmol/L (ref 134–144)
eGFR: 74 mL/min/{1.73_m2} (ref >59)

## 2022-01-10 LAB — TSH+FREE T4
Free T4: 1.37 ng/dL (ref 0.82–1.77)
TSH: 0.193 u[IU]/mL — ABNORMAL LOW (ref 0.450–4.500)

## 2022-01-10 LAB — HEPATIC FUNCTION PANEL
ALT: 18 IU/L (ref 0–32)
AST: 15 IU/L (ref 0–40)
Albumin: 4.6 g/dL (ref 3.8–4.9)
Alkaline Phosphatase: 69 IU/L (ref 44–121)
Bilirubin Total: 0.2 mg/dL (ref 0.0–1.2)
Bilirubin, Direct: <0.1 mg/dL (ref 0.00–0.40)
Total Protein: 7.1 g/dL (ref 6.0–8.5)

## 2022-02-19 NOTE — Progress Notes (Signed)
Established patient visit  I,Joseline E Rosas,acting as a scribe for [employer], PA-C.,have documented all relevant documentation on the behalf of Mikey Kirschner, PA-C,as directed by  Mikey Kirschner, PA-C while in the presence of Mikey Kirschner, PA-C.   Patient: Laura Wilkinson   DOB: 17-Sep-1970   51 y.o. Female  MRN: [id number] Visit Date: 02/20/2022  Today's healthcare provider: Mikey Kirschner, PA-C   Chief Complaint  Patient presents with   follow-up HTN   Subjective    HPI  Hypertension, follow-up  BP Readings from Last 3 Encounters:  02/20/22 123/89  01/09/22 (!) 149/102  11/18/21 (!) 147/95   Wt Readings from Last 3 Encounters:  02/20/22 245 lb 9.6 oz (111.4 kg)  01/09/22 239 lb 9.6 oz (108.7 kg)  11/18/21 234 lb 11.2 oz (106.5 kg)     She was last seen for hypertension 6 weeks ago.  BP at that visit was 149/102. Management since that visit includes increase to losartan 50 mg.  She reports excellent compliance with treatment. She is not having side effects.  She is following a Regular diet. She is exercising, cardio and light weights. She does not smoke.  BP readings at home:130's/80's-90's Symptoms: No chest pain No chest pressure  No palpitations No syncope  No dyspnea No orthopnea  No paroxysmal nocturnal dyspnea No lower extremity edema   Pertinent labs Lab Results  Component Value Date   CHOL 198 01/09/2022   HDL 47 01/09/2022   LDLCALC 112 (H) 01/09/2022   TRIG 227 (H) 01/09/2022   CHOLHDL 4.2 01/09/2022   Lab Results  Component Value Date   NA 143 01/09/2022   K 4.5 01/09/2022   CREATININE 0.93 01/09/2022   EGFR 74 01/09/2022   GLUCOSE 86 01/09/2022   TSH 0.193 (L) 01/09/2022     The 10-year ASCVD risk score (Arnett DK, et al., 2019) is: 3.9%   Pt reports some heart palpitations -- describes as feeling like her heart is beating very fast and hard. Occurs 1-2 times a  week. ---------------------------------------------------------------------------------------------------   Medications: Outpatient Medications Prior to Visit  Medication Sig   Calcium Carb-Cholecalciferol (CALCIUM 500+D PO) Take 2 tablets by mouth 2 (two) times daily.   fluticasone (FLONASE) 50 MCG/ACT nasal spray Place 2 sprays into both nostrils daily.   levothyroxine (SYNTHROID) 125 MCG tablet Take 1 tablet (125 mcg total) by mouth daily.   loratadine (CLARITIN) 10 MG tablet Take 1 tablet (10 mg total) by mouth daily.   losartan (COZAAR) 50 MG tablet Take 1 tablet (50 mg total) by mouth daily.   lovastatin (MEVACOR) 20 MG tablet Take 1 tablet (20 mg total) by mouth at bedtime.   MAGNESIUM PO Take by mouth daily.   Multiple Vitamin (MULTIVITAMIN) tablet Take 1 tablet by mouth daily.   OMEGA-3 FATTY ACIDS PO Take 2 capsules by mouth daily.   triamcinolone cream (KENALOG) 0.1 % Apply topically daily as needed.   [DISCONTINUED] Semaglutide-Weight Management (WEGOVY) 0.5 MG/0.5ML SOAJ Inject 0.5 mg into the skin once a week.   No facility-administered medications prior to visit.    Review of Systems  Constitutional:  Negative for fatigue and fever.  Respiratory:  Negative for cough and shortness of breath.   Cardiovascular:  Positive for palpitations. Negative for chest pain and leg swelling.  Gastrointestinal:  Negative for abdominal pain.  Neurological:  Negative for dizziness and headaches.      Objective    BP 123/89 (BP Location: Left Arm, Patient Position:  Sitting, Cuff Size: Large)   Pulse 90   Temp 98.5 F (36.9 C) (Oral)   Resp 16   Ht '5\' 1"'  (1.549 m)   Wt 245 lb 9.6 oz (111.4 kg)   BMI 46.41 kg/m   Physical Exam Constitutional:      General: She is awake.     Appearance: She is well-developed.  HENT:     Head: Normocephalic.  Eyes:     Conjunctiva/sclera: Conjunctivae normal.  Cardiovascular:     Rate and Rhythm: Normal rate and regular rhythm.     Heart  sounds: Normal heart sounds.  Pulmonary:     Effort: Pulmonary effort is normal.     Breath sounds: Normal breath sounds.  Skin:    General: Skin is warm.  Neurological:     Mental Status: She is alert and oriented to person, place, and time.  Psychiatric:        Attention and Perception: Attention normal.        Mood and Affect: Mood normal.        Speech: Speech normal.        Behavior: Behavior is cooperative.      No results found for any visits on 02/20/22.  Assessment & Plan     Problem List Items Addressed This Visit       Cardiovascular and Mediastinum   Primary hypertension - Primary    In range! Continue losartan 50 mg.  F/u 3-4 mo        Endocrine   Hypothyroidism    Borderline hyperthyroid last check, given heart palpitations; will recheck tsh/t4 and likely dec med      Relevant Orders   TSH + free T4     Other   Morbid obesity ([place])    Pt has been unable to get wegovy 0.5 mg and has gained ~5 lbs back (but she is consistently working out/weight lifting).  Rx wegovy 1 mg, cautioned of increased side effects given she has been without for 2-3 weeks.       Relevant Medications   Semaglutide-Weight Management 1 MG/0.5ML SOAJ     Return in about 3 months (around 05/23/2022) for weight Management.      I, Mikey Kirschner, PA-C have reviewed all documentation for this visit. The documentation on  02/20/2022 for the exam, diagnosis, procedures, and orders are all accurate and complete.Mikey Kirschner, PA-C [facility] [address] [place], [place], [address] Office: [phone] Fax: [place]

## 2022-02-20 ENCOUNTER — Ambulatory Visit: Payer: BC Managed Care – PPO | Admitting: Physician Assistant

## 2022-02-20 ENCOUNTER — Encounter: Payer: Self-pay | Admitting: Physician Assistant

## 2022-02-20 VITALS — BP 123/89 | HR 90 | Temp 98.5°F | Resp 16 | Ht 61.0 in | Wt 245.6 lb

## 2022-02-20 DIAGNOSIS — I10 Essential (primary) hypertension: Secondary | ICD-10-CM | POA: Diagnosis not present

## 2022-02-20 DIAGNOSIS — E039 Hypothyroidism, unspecified: Secondary | ICD-10-CM

## 2022-02-20 MED ORDER — SEMAGLUTIDE-WEIGHT MANAGEMENT 1 MG/0.5ML ~~LOC~~ SOAJ: 1.0000 mg | SUBCUTANEOUS | 1 refills | Status: DC

## 2022-02-20 NOTE — Assessment & Plan Note (Signed)
In range! Continue losartan 50 mg.  F/u 3-4 mo

## 2022-02-20 NOTE — Assessment & Plan Note (Signed)
Pt has been unable to get wegovy 0.5 mg and has gained ~5 lbs back (but she is consistently working out/weight lifting).  Rx wegovy 1 mg, cautioned of increased side effects given she has been without for 2-3 weeks.

## 2022-02-20 NOTE — Assessment & Plan Note (Signed)
Borderline hyperthyroid last check, given heart palpitations; will recheck tsh/t4 and likely dec med

## 2022-02-21 LAB — TSH+FREE T4
Free T4: 1.59 ng/dL (ref 0.82–1.77)
TSH: 1.14 u[IU]/mL (ref 0.450–4.500)

## 2022-03-17 ENCOUNTER — Other Ambulatory Visit: Payer: Self-pay | Admitting: Physician Assistant

## 2022-05-11 ENCOUNTER — Other Ambulatory Visit: Payer: Self-pay | Admitting: Physician Assistant

## 2022-05-11 DIAGNOSIS — J301 Allergic rhinitis due to pollen: Secondary | ICD-10-CM

## 2022-05-11 DIAGNOSIS — H9311 Tinnitus, right ear: Secondary | ICD-10-CM

## 2022-05-12 ENCOUNTER — Ambulatory Visit: Payer: Self-pay

## 2022-05-12 ENCOUNTER — Telehealth: Payer: BC Managed Care – PPO

## 2022-05-12 NOTE — Telephone Encounter (Addendum)
Called pt back . Call went to VM  - mailbox is full unable to LM. I noticed that email address that pt gave was different than the one in the chart. Should pt call back please reschedule VV.   Chief Complaint: COVID +  Symptoms: Cough, HA, fever Frequency: Sunday Pertinent Negatives: Patient denies sob Disposition: []ED /[]Urgent Care (no appt availability in office) / []Appointment(In office/virtual)/ [x] [place] [facility]/ []Home Care/ []Refused Recommended Disposition /[][place] [facility]/ [] Follow-up with PCP Additional Notes: Pt came back form a cruise and started having COVID S/S Sunday evening. Home test today was +. PT has HA, cough, Fever, and nasal congestion. Pt would like medication to help with S/s.   Summary: Covid positive, seeking Rx   Pt has tested positive for Covid today, her symptoms began Sunday evening. Altogether, she is experiencing headache - fever - cough - ache - no appetite - nasal congestion.   She is seeking a Rx for Paxlovid.   [id number] - [place], [place] - [address]  [address] [place] [address]  Phone: [phone] Fax: [phone]      Reason for Disposition  [1] HIGH RISK patient (e.g., weak immune system, age > 64 years, obesity with BMI 30 or higher, pregnant, chronic lung disease or other chronic medical condition) AND [2] COVID symptoms (e.g., cough, fever)  (Exceptions: Already seen by PCP and no new or worsening symptoms.)  Answer Assessment - Initial Assessment Questions 1. COVID-19 DIAGNOSIS: "How do you know that you have COVID?" (e.g., positive lab test or self-test, diagnosed by doctor or NP/PA, symptoms after exposure).     Home test 2. COVID-19 EXPOSURE: "Was there any known exposure to COVID before the symptoms began?" CDC Definition of close contact: within 6 feet (2 meters) for a total of 15 minutes or more over a 24-hour period.      Came back from a cruise 3. ONSET: "When did the COVID-19  symptoms start?"      Sunday evening 4. WORST SYMPTOM: "What is your worst symptom?" (e.g., cough, fever, shortness of breath, muscle aches)     Cough and HA 5. COUGH: "Do you have a cough?" If Yes, ask: "How bad is the cough?"       Yes - mostly dry - throat burns 6. FEVER: "Do you have a fever?" If Yes, ask: "What is your temperature, how was it measured, and when did it start?"     Yes - 101.6 7. RESPIRATORY STATUS: "Describe your breathing?" (e.g., normal; shortness of breath, wheezing, unable to speak)      no 8. BETTER-SAME-WORSE: "Are you getting better, staying the same or getting worse compared to yesterday?"  If getting worse, ask, "In what way?"     A slight bit better 9. OTHER SYMPTOMS: "Do you have any other symptoms?"  (e.g., chills, fatigue, headache, loss of smell or taste, muscle pain, sore throat)     Nasal congestion 10. HIGH RISK DISEASE: "Do you have any chronic medical problems?" (e.g., asthma, heart or lung disease, weak immune system, obesity, etc.)       no 11. VACCINE: "Have you had the COVID-19 vaccine?" If Yes, ask: "Which one, how many shots, when did you get it?"        12. PREGNANCY: "Is there any chance you are pregnant?" "When was your last menstrual period?"        13 . O2 SATURATION MONITOR:  "Do you use  an oxygen saturation monitor (pulse oximeter) at home?" If Yes, ask "What is your reading (oxygen level) today?" "What is your usual oxygen saturation reading?" (e.g., 95%)  Protocols used: Coronavirus (COVID-19) Diagnosed or Suspected-A-AH

## 2022-05-22 ENCOUNTER — Ambulatory Visit: Payer: BC Managed Care – PPO | Admitting: Physician Assistant

## 2022-06-01 ENCOUNTER — Encounter (INDEPENDENT_AMBULATORY_CARE_PROVIDER_SITE_OTHER): Payer: BC Managed Care – PPO | Admitting: Physician Assistant

## 2022-06-01 DIAGNOSIS — R051 Acute cough: Secondary | ICD-10-CM | POA: Diagnosis not present

## 2022-06-01 NOTE — Telephone Encounter (Signed)

## 2022-06-03 ENCOUNTER — Other Ambulatory Visit: Payer: Self-pay | Admitting: Physician Assistant

## 2022-06-03 ENCOUNTER — Telehealth: Payer: Self-pay

## 2022-06-03 MED ORDER — WEGOVY 0.5 MG/0.5ML ~~LOC~~ SOAJ: 0.5000 mg | SUBCUTANEOUS | 2 refills | Status: DC

## 2022-06-03 NOTE — Telephone Encounter (Signed)
-----   Message from Alfredia Ferguson, PA-C sent at 06/03/2022  9:06 AM EST ----- I got a faxed refill request for wegovy 0.5 mg but she is on 1 mg?  Can you confirm the dose she is taking and if she needs a refill

## 2022-06-03 NOTE — Telephone Encounter (Signed)
Letter sent advising patient to contact office to schedule CPE in [place]

## 2022-06-03 NOTE — Telephone Encounter (Signed)
Pt returned Laura Wilkinson's and she stated she does need the refill and she does prefer the 0.5mg  / she only started getting the 1mg  RX due to the pharmacy never having 0.5 in stock / please advise

## 2022-06-18 ENCOUNTER — Other Ambulatory Visit: Payer: Self-pay | Admitting: Physician Assistant

## 2022-06-18 DIAGNOSIS — Z1231 Encounter for screening mammogram for malignant neoplasm of breast: Secondary | ICD-10-CM

## 2022-07-03 ENCOUNTER — Ambulatory Visit: Payer: BC Managed Care – PPO | Admitting: Physician Assistant

## 2022-07-03 ENCOUNTER — Encounter: Payer: Self-pay | Admitting: Physician Assistant

## 2022-07-03 VITALS — BP 141/91 | HR 81 | Temp 98.3°F | Resp 16 | Ht 62.0 in | Wt 252.1 lb

## 2022-07-03 DIAGNOSIS — M7989 Other specified soft tissue disorders: Secondary | ICD-10-CM

## 2022-07-03 DIAGNOSIS — I10 Essential (primary) hypertension: Secondary | ICD-10-CM

## 2022-07-03 DIAGNOSIS — R053 Chronic cough: Secondary | ICD-10-CM | POA: Diagnosis not present

## 2022-07-03 MED ORDER — LOSARTAN POTASSIUM 100 MG PO TABS: 100.0000 mg | ORAL_TABLET | Freq: Every day | ORAL | 1 refills | Status: DC

## 2022-07-03 MED ORDER — FEXOFENADINE HCL 180 MG PO TABS: 180.0000 mg | ORAL_TABLET | Freq: Every day | ORAL | 1 refills | Status: DC

## 2022-07-03 NOTE — Assessment & Plan Note (Addendum)
No edema present Just asymmetric tissue No L axillary lymphadenopathy

## 2022-07-03 NOTE — Assessment & Plan Note (Signed)
Nothing present on exam Order bnp  Advised compression socks, elevate feet If recurrs would ref to vascular since unilateral, h/o varicose vein procedure

## 2022-07-03 NOTE — Assessment & Plan Note (Addendum)
Chronic, uncontrolled. Managed with losartan 50 mg advised increasing to 100 mg daily. F/u 6-8 weeks

## 2022-07-03 NOTE — Progress Notes (Signed)
I,Joseline E Rosas,acting as a scribe for [employer], PA-C.,have documented all relevant documentation on the behalf of Mikey Kirschner, PA-C,as directed by  Mikey Kirschner, PA-C while in the presence of Mikey Kirschner, PA-C.   Established patient visit   Patient: Laura Wilkinson   DOB: 07-03-1971   51 y.o. Female  MRN: [id number] Visit Date: 07/03/2022  Today's healthcare provider: Mikey Kirschner, PA-C   Chief Complaint  Patient presents with   Follow-Up BP   Cough   Subjective    HPI  Hypertension, follow-up  BP Readings from Last 3 Encounters:  07/03/22 (!) 141/91  02/20/22 123/89  01/09/22 (!) 149/102   Wt Readings from Last 3 Encounters:  07/03/22 252 lb 1.6 oz (114.4 kg)  02/20/22 245 lb 9.6 oz (111.4 kg)  01/09/22 239 lb 9.6 oz (108.7 kg)     She was last seen for hypertension 3-4 months ago.  BP at that visit was 123/89. Management since that visit includes Continue Losartan 50 mg.  She reports excellent compliance with treatment.   Outside blood pressures are 140-130/90's. Symptoms: No chest pain No chest pressure  No palpitations No syncope  No dyspnea No orthopnea  No paroxysmal nocturnal dyspnea Yes lower extremity edema-Mostly on left leg   Pertinent labs Lab Results  Component Value Date   CHOL 198 01/09/2022   HDL 47 01/09/2022   LDLCALC 112 (H) 01/09/2022   TRIG 227 (H) 01/09/2022   CHOLHDL 4.2 01/09/2022   Lab Results  Component Value Date   NA 143 01/09/2022   K 4.5 01/09/2022   CREATININE 0.93 01/09/2022   EGFR 74 01/09/2022   GLUCOSE 86 01/09/2022   TSH 1.140 02/20/2022     The 10-year ASCVD risk score (Arnett DK, et al., 2019) is: 6.9%  ---------------------------------------------------------------------------------------------------  Left Arm: Patient concern about Left arm being bigger. This has been going on for a while. Reports that she cuts her clothes on that side because it bothers her.No swelling. Denies pain,  erythema. Denies swelling in her hand.  Cough: Reports that cough comes and goes. She has been taking Mucinex. TheraFlu, cough syrup. Reports that this cough is since she had Covid back in October. No other symptoms.  Medications: Outpatient Medications Prior to Visit  Medication Sig   Calcium Carb-Cholecalciferol (CALCIUM 500+D PO) Take 2 tablets by mouth 2 (two) times daily.   fluticasone (FLONASE) 50 MCG/ACT nasal spray Place 2 sprays into both nostrils daily.   levothyroxine (SYNTHROID) 125 MCG tablet Take 1 tablet (125 mcg total) by mouth daily.   lovastatin (MEVACOR) 20 MG tablet Take 1 tablet (20 mg total) by mouth at bedtime.   MAGNESIUM PO Take by mouth daily.   Multiple Vitamin (MULTIVITAMIN) tablet Take 1 tablet by mouth daily.   OMEGA-3 FATTY ACIDS PO Take 2 capsules by mouth daily.   Semaglutide-Weight Management (WEGOVY) 0.5 MG/0.5ML SOAJ Inject 0.5 mg into the skin once a week.   triamcinolone cream (KENALOG) 0.1 % Apply topically daily as needed.   [DISCONTINUED] loratadine (CLARITIN) 10 MG tablet Take 1 tablet by mouth once daily   [DISCONTINUED] losartan (COZAAR) 50 MG tablet Take 1 tablet (50 mg total) by mouth daily.   No facility-administered medications prior to visit.    Review of Systems  Constitutional:  Negative for fatigue and fever.  Respiratory:  Positive for cough. Negative for shortness of breath.   Cardiovascular:  Positive for leg swelling. Negative for chest pain.  Gastrointestinal:  Negative for abdominal  pain.  Neurological:  Negative for dizziness and headaches.      Objective    BP (!) 141/91 (BP Location: Right Arm, Patient Position: Sitting, Cuff Size: Large)   Pulse 81   Temp 98.3 F (36.8 C) (Oral)   Resp 16   Ht _0  (1.575 m)   Wt 252 lb 1.6 oz (114.4 kg)   SpO2 99%   BMI 46.11 kg/m    Physical Exam Constitutional:      General: She is awake.     Appearance: She is well-developed.  HENT:     Head: Normocephalic.  Eyes:      Conjunctiva/sclera: Conjunctivae normal.  Cardiovascular:     Rate and Rhythm: Normal rate and regular rhythm.     Heart sounds: Normal heart sounds.  Pulmonary:     Effort: Pulmonary effort is normal.     Breath sounds: Normal breath sounds.  Musculoskeletal:     Right lower leg: No edema.     Left lower leg: No edema.     Comments: Pt shows pictures of L lower extremity pitting edema. Nothing present today  L upper arm is visibly larger than R upper arm  Lymphadenopathy:     Upper Body:     Right upper body: No axillary adenopathy.     Left upper body: No axillary adenopathy.  Skin:    General: Skin is warm.  Neurological:     Mental Status: She is alert and oriented to person, place, and time.  Psychiatric:        Attention and Perception: Attention normal.        Mood and Affect: Mood normal.        Speech: Speech normal.        Behavior: Behavior is cooperative.      No results found for any visits on 07/03/22.  Assessment & Plan     Problem List Items Addressed This Visit       Cardiovascular and Mediastinum   Primary hypertension - Primary    Chronic, uncontrolled. Managed with losartan 50 mg advised increasing to 100 mg daily. F/u 6-8 weeks        Relevant Medications   losartan (COZAAR) 100 MG tablet     Other   Left arm swelling    No edema present Just asymmetric tissue No L axillary lymphadenopathy       Chronic cough    Post viral cough Advised increasing water intake Switch claritin to allegra  Continue flonase      Relevant Medications   fexofenadine (ALLEGRA ALLERGY) 180 MG tablet   Left leg swelling    Nothing present on exam Order bnp  Advised compression socks, elevate feet If recurrs would ref to vascular since unilateral, h/o varicose vein procedure       Relevant Orders   B Nat Peptide     Return in about 6 weeks (around 08/14/2022) for hypertension.      I, Mikey Kirschner, PA-C have reviewed all documentation for this  visit. The documentation on  07/03/2022 for the exam, diagnosis, procedures, and orders are all accurate and complete.  Mikey Kirschner, PA-C [facility] [address] [place], [place], [address] Office: [phone] Fax: [place]

## 2022-07-03 NOTE — Assessment & Plan Note (Signed)
Post viral cough Advised increasing water intake Switch claritin to allegra  Continue flonase

## 2022-07-04 LAB — BRAIN NATRIURETIC PEPTIDE: BNP: <2.5 pg/mL (ref 0.0–100.0)

## 2022-07-07 NOTE — Progress Notes (Signed)
Normal BNP enzyme; this is reassuring.

## 2022-07-17 ENCOUNTER — Ambulatory Visit
Admission: RE | Admit: 2022-07-17 | Discharge: 2022-07-17 | Disposition: A | Discharge status: Home / Self Care | Payer: BC Managed Care – PPO | Source: Ambulatory Visit | Attending: Physician Assistant | Admitting: Physician Assistant

## 2022-07-17 DIAGNOSIS — Z1231 Encounter for screening mammogram for malignant neoplasm of breast: Secondary | ICD-10-CM | POA: Insufficient documentation

## 2022-09-04 ENCOUNTER — Ambulatory Visit (INDEPENDENT_AMBULATORY_CARE_PROVIDER_SITE_OTHER): Payer: BC Managed Care – PPO | Admitting: Physician Assistant

## 2022-09-04 ENCOUNTER — Encounter: Payer: Self-pay | Admitting: Physician Assistant

## 2022-09-04 VITALS — BP 134/82 | HR 84 | Ht 64.5 in | Wt 252.0 lb

## 2022-09-04 DIAGNOSIS — I10 Essential (primary) hypertension: Secondary | ICD-10-CM | POA: Diagnosis not present

## 2022-09-04 DIAGNOSIS — R61 Generalized hyperhidrosis: Secondary | ICD-10-CM | POA: Diagnosis not present

## 2022-09-04 DIAGNOSIS — R29898 Other symptoms and signs involving the musculoskeletal system: Secondary | ICD-10-CM

## 2022-09-04 NOTE — Assessment & Plan Note (Signed)
Discussion of continuing losartan 100 mg or reducing to 50 mg and adding 12.5 mg of hctz Advised d/c otc diruetic Pt will keep with losartan for now F.u 6 mo

## 2022-09-04 NOTE — Assessment & Plan Note (Signed)
Advised compression socks, to discuss with vascular

## 2022-09-04 NOTE — Progress Notes (Signed)
Established patient visit   Patient: Laura Wilkinson   DOB: Jul 18, 1970   52 y.o. Female  MRN: [id number] Visit Date: 09/04/2022  Today's healthcare provider: Mikey Kirschner, PA-C   Cc. Htn f/u  Subjective    HPI  Pt reports for the last three weeks having worsening night sweats. Denies changing pjs but has felt the sweat running. She still has her ovaries but is s/p hysterectomy.   She reports more frequent headaches, tylenol will work, but has needed it more frequently. At the top of her head.   She reports a bilateral leg heaviness and some swelling. She started a otc fluid pill. She is wearing compression socks.  Hypertension, follow-up  BP Readings from Last 3 Encounters:  09/04/22 134/82  07/03/22 (!) 141/91  02/20/22 123/89   Wt Readings from Last 3 Encounters:  09/04/22 252 lb (114.3 kg)  07/03/22 252 lb 1.6 oz (114.4 kg)  02/20/22 245 lb 9.6 oz (111.4 kg)     She was last seen for hypertension 07/03/22. Pt's losartan was increased to 100 mg.     Pertinent labs Lab Results  Component Value Date   CHOL 198 01/09/2022   HDL 47 01/09/2022   LDLCALC 112 (H) 01/09/2022   TRIG 227 (H) 01/09/2022   CHOLHDL 4.2 01/09/2022   Lab Results  Component Value Date   NA 143 01/09/2022   K 4.5 01/09/2022   CREATININE 0.93 01/09/2022   EGFR 74 01/09/2022   GLUCOSE 86 01/09/2022   TSH 1.140 02/20/2022     The 10-year ASCVD risk score (Arnett DK, et al., 2019) is: 5.7%  ---------------------------------------------------------------------------------------------------   Medications: Outpatient Medications Prior to Visit  Medication Sig   Calcium Carb-Cholecalciferol (CALCIUM 500+D PO) Take 2 tablets by mouth 2 (two) times daily.   fexofenadine (ALLEGRA ALLERGY) 180 MG tablet Take 1 tablet (180 mg total) by mouth daily.   levothyroxine (SYNTHROID) 125 MCG tablet Take 1 tablet (125 mcg total) by mouth daily.   losartan (COZAAR) 100 MG tablet Take 1 tablet (100  mg total) by mouth daily.   lovastatin (MEVACOR) 20 MG tablet Take 1 tablet (20 mg total) by mouth at bedtime.   MAGNESIUM PO Take by mouth daily.   Multiple Vitamin (MULTIVITAMIN) tablet Take 1 tablet by mouth daily.   OMEGA-3 FATTY ACIDS PO Take 2 capsules by mouth daily.   [DISCONTINUED] fluticasone (FLONASE) 50 MCG/ACT nasal spray Place 2 sprays into both nostrils daily.   [DISCONTINUED] Semaglutide-Weight Management (WEGOVY) 0.5 MG/0.5ML SOAJ Inject 0.5 mg into the skin once a week.   [DISCONTINUED] triamcinolone cream (KENALOG) 0.1 % Apply topically daily as needed.   No facility-administered medications prior to visit.    Review of Systems  Constitutional:  Positive for diaphoresis. Negative for fatigue and fever.  Respiratory:  Negative for cough and shortness of breath.   Cardiovascular:  Negative for chest pain and leg swelling.  Gastrointestinal:  Negative for abdominal pain.  Neurological:  Positive for headaches. Negative for dizziness.       Objective    BP 134/82 (BP Location: Left Arm, Patient Position: Sitting, Cuff Size: Large)   Pulse 84   Ht 5' 4.5" (1.638 m)   Wt 252 lb (114.3 kg)   SpO2 98%   BMI 42.59 kg/m    Physical Exam Constitutional:      General: She is awake.     Appearance: She is well-developed.  HENT:     Head: Normocephalic.  Eyes:  Conjunctiva/sclera: Conjunctivae normal.  Cardiovascular:     Rate and Rhythm: Normal rate and regular rhythm.     Heart sounds: Normal heart sounds.  Pulmonary:     Effort: Pulmonary effort is normal.     Breath sounds: Normal breath sounds.  Skin:    General: Skin is warm.  Neurological:     Mental Status: She is alert and oriented to person, place, and time.  Psychiatric:        Attention and Perception: Attention normal.        Mood and Affect: Mood normal.        Speech: Speech normal.        Behavior: Behavior is cooperative.      No results found for any visits on 09/04/22.  Assessment  & Plan     Problem List Items Addressed This Visit       Cardiovascular and Mediastinum   Primary hypertension - Primary    Discussion of continuing losartan 100 mg or reducing to 50 mg and adding 12.5 mg of hctz Advised d/c otc diruetic Pt will keep with losartan for now F.u 6 mo       Relevant Orders   Comprehensive Metabolic Panel (CMET)     Other   Night sweats    May be menopause Will check thyroid again      Relevant Orders   TSH + free T4   Leg heaviness    Advised compression socks, to discuss with vascular        Return in about 6 months (around 03/05/2023) for CPE.      I, Mikey Kirschner, PA-C have reviewed all documentation for this visit. The documentation on  09/04/22 for the exam, diagnosis, procedures, and orders are all accurate and complete.  Mikey Kirschner, PA-C [facility] [address] [place], [place], [address] Office: [phone] Fax: [place]

## 2022-09-04 NOTE — Assessment & Plan Note (Signed)
May be menopause Will check thyroid again

## 2022-09-05 LAB — COMPREHENSIVE METABOLIC PANEL
ALT: 21 IU/L (ref 0–32)
AST: 16 IU/L (ref 0–40)
Albumin/Globulin Ratio: 1.8 (ref 1.2–2.2)
Albumin: 4.4 g/dL (ref 3.8–4.9)
Alkaline Phosphatase: 68 IU/L (ref 44–121)
BUN/Creatinine Ratio: 15 (ref 9–23)
BUN: 13 mg/dL (ref 6–24)
Bilirubin Total: 0.2 mg/dL (ref 0.0–1.2)
CO2: 23 mmol/L (ref 20–29)
Calcium: 9 mg/dL (ref 8.7–10.2)
Chloride: 102 mmol/L (ref 96–106)
Creatinine, Ser: 0.88 mg/dL (ref 0.57–1.00)
Globulin, Total: 2.4 g/dL (ref 1.5–4.5)
Glucose: 88 mg/dL (ref 70–99)
Potassium: 4.1 mmol/L (ref 3.5–5.2)
Sodium: 141 mmol/L (ref 134–144)
Total Protein: 6.8 g/dL (ref 6.0–8.5)
eGFR: 79 mL/min/{1.73_m2} (ref >59)

## 2022-09-05 LAB — TSH+FREE T4
Free T4: 1.67 ng/dL (ref 0.82–1.77)
TSH: 0.177 u[IU]/mL — ABNORMAL LOW (ref 0.450–4.500)

## 2022-09-07 ENCOUNTER — Other Ambulatory Visit: Payer: Self-pay | Admitting: Physician Assistant

## 2022-09-07 DIAGNOSIS — E039 Hypothyroidism, unspecified: Secondary | ICD-10-CM

## 2022-09-07 MED ORDER — LEVOTHYROXINE SODIUM 112 MCG PO TABS: 112.0000 ug | ORAL_TABLET | Freq: Every day | ORAL | 3 refills | Status: DC

## 2022-09-15 ENCOUNTER — Other Ambulatory Visit: Payer: Self-pay | Admitting: Physician Assistant

## 2022-09-15 DIAGNOSIS — I10 Essential (primary) hypertension: Secondary | ICD-10-CM

## 2022-10-21 ENCOUNTER — Other Ambulatory Visit: Payer: Self-pay | Admitting: Physician Assistant

## 2022-10-21 DIAGNOSIS — I10 Essential (primary) hypertension: Secondary | ICD-10-CM

## 2022-12-15 ENCOUNTER — Other Ambulatory Visit: Payer: Self-pay | Admitting: Physician Assistant

## 2022-12-15 DIAGNOSIS — E7849 Other hyperlipidemia: Secondary | ICD-10-CM

## 2023-01-27 ENCOUNTER — Telehealth: Payer: Self-pay

## 2023-01-27 NOTE — Telephone Encounter (Signed)
Copied from CRM [id number]. Topic: Appointment Scheduling - Scheduling Inquiry for Clinic >> Jan 27, 2023  1:18 PM Everette C wrote: Reason for CRM: The patient would like to be contacted by a member of administrative when possible to discuss scheduling of future appts  Please contact further when possible

## 2023-01-28 NOTE — Telephone Encounter (Signed)
Patient had already rescheduled her appt with Merita Norton, FNP.

## 2023-02-26 ENCOUNTER — Ambulatory Visit: Payer: BC Managed Care – PPO | Admitting: Family Medicine

## 2023-02-26 ENCOUNTER — Ambulatory Visit: Payer: BC Managed Care – PPO | Admitting: Physician Assistant

## 2023-02-26 DIAGNOSIS — E7849 Other hyperlipidemia: Secondary | ICD-10-CM

## 2023-02-26 DIAGNOSIS — R7303 Prediabetes: Secondary | ICD-10-CM | POA: Diagnosis not present

## 2023-02-26 DIAGNOSIS — I10 Essential (primary) hypertension: Secondary | ICD-10-CM | POA: Diagnosis not present

## 2023-02-26 DIAGNOSIS — E039 Hypothyroidism, unspecified: Secondary | ICD-10-CM

## 2023-02-26 NOTE — Assessment & Plan Note (Signed)
5.6 in 2022; ongoing HTN and HLD with m obesity Recommend A1c Continue to recommend balanced, lower carb meals. Smaller meal size, adding snacks. Choosing water as drink of choice and increasing purposeful exercise. Pt declines known family hx of pre-/T2DM

## 2023-02-26 NOTE — Assessment & Plan Note (Signed)
Chronic, slight improvement Repeat labs Discussed importance of healthy weight management Discussed diet and exercise

## 2023-02-26 NOTE — Assessment & Plan Note (Signed)
Chronic, stable LE edema improved with diet and exercise BP remains borderline Continue to monitor with goal of 139/89 or less Remains on losartan 100 mg daily

## 2023-02-26 NOTE — Progress Notes (Signed)
Established patient visit   Patient: Laura Wilkinson   DOB: 09-12-1970   52 y.o. Female  MRN: [id number] Visit Date: 02/26/2023  Today's healthcare provider: Jacky Kindle, FNP  Introduced to nurse practitioner role and practice setting.  All questions answered.  Discussed provider/patient relationship and expectations.  Subjective    HPI HPI     Medical Management of Chronic Issues    Additional comments: 6 month follow up on hypothyroid, hypertension, and check cholesterol       Last edited by Rolly Salter, CMA on 02/26/2023  8:29 AM.      Medications: Outpatient Medications Prior to Visit  Medication Sig   levothyroxine (SYNTHROID) 112 MCG tablet Take 1 tablet (112 mcg total) by mouth daily.   losartan (COZAAR) 100 MG tablet Take 1 tablet by mouth once daily   lovastatin (MEVACOR) 20 MG tablet TAKE 1 TABLET BY MOUTH AT BEDTIME   [DISCONTINUED] fexofenadine (ALLEGRA ALLERGY) 180 MG tablet Take 1 tablet (180 mg total) by mouth daily.   [DISCONTINUED] Calcium Carb-Cholecalciferol (CALCIUM 500+D PO) Take 2 tablets by mouth 2 (two) times daily. (Patient not taking: Reported on 02/26/2023)   [DISCONTINUED] MAGNESIUM PO Take by mouth daily. (Patient not taking: Reported on 02/26/2023)   [DISCONTINUED] Multiple Vitamin (MULTIVITAMIN) tablet Take 1 tablet by mouth daily. (Patient not taking: Reported on 02/26/2023)   [DISCONTINUED] OMEGA-3 FATTY ACIDS PO Take 2 capsules by mouth daily. (Patient not taking: Reported on 02/26/2023)   No facility-administered medications prior to visit.    Review of Systems    Objective    BP 136/88 Comment: home  Pulse 62   Ht 5\' 3"  (1.6 m)   Wt 250 lb 6.4 oz (113.6 kg)   SpO2 100%   BMI 44.36 kg/m   Physical Exam Vitals and nursing note reviewed.  Constitutional:      General: She is not in acute distress.    Appearance: Normal appearance. She is obese. She is not ill-appearing, toxic-appearing or diaphoretic.  HENT:     Head:  Normocephalic and atraumatic.  Cardiovascular:     Rate and Rhythm: Normal rate and regular rhythm.     Pulses: Normal pulses.     Heart sounds: Normal heart sounds. No murmur heard.    No friction rub. No gallop.  Pulmonary:     Effort: Pulmonary effort is normal. No respiratory distress.     Breath sounds: Normal breath sounds. No stridor. No wheezing, rhonchi or rales.  Chest:     Chest wall: No tenderness.  Musculoskeletal:        General: No swelling, tenderness, deformity or signs of injury. Normal range of motion.     Right lower leg: No edema.     Left lower leg: No edema.  Skin:    General: Skin is warm and dry.     Capillary Refill: Capillary refill takes less than 2 seconds.     Coloration: Skin is not jaundiced or pale.     Findings: No bruising, erythema, lesion or rash.  Neurological:     General: No focal deficit present.     Mental Status: She is alert and oriented to person, place, and time. Mental status is at baseline.     Cranial Nerves: No cranial nerve deficit.     Sensory: No sensory deficit.     Motor: No weakness.     Coordination: Coordination normal.  Psychiatric:        Mood and Affect: Mood  normal.        Behavior: Behavior normal.        Thought Content: Thought content normal.        Judgment: Judgment normal.     No results found for any visits on 02/26/23.  Assessment & Plan     Problem List Items Addressed This Visit       Cardiovascular and Mediastinum   Primary hypertension    Chronic, stable LE edema improved with diet and exercise BP remains borderline Continue to monitor with goal of 139/89 or less Remains on losartan 100 mg daily       Relevant Orders   CBC   Basic Metabolic Panel (BMET)   TSH     Endocrine   Hypothyroidism    Chronic, variable readings with low TSH previously On 112 synthroid Repeat labs Pt without complaints       Relevant Orders   TSH     Other   Borderline diabetic    5.6 in 2022; ongoing  HTN and HLD with m obesity Recommend A1c Continue to recommend balanced, lower carb meals. Smaller meal size, adding snacks. Choosing water as drink of choice and increasing purposeful exercise. Pt declines known family hx of pre-/T2DM      Relevant Orders   Hemoglobin A1c   HLD (hyperlipidemia)   Relevant Orders   Lipid panel   Morbid obesity (HCC) - Primary    Chronic, slight improvement Repeat labs Discussed importance of healthy weight management Discussed diet and exercise       Relevant Orders   CBC   Basic Metabolic Panel (BMET)   Lipid panel   TSH   Hemoglobin A1c   Return in about 6 months (around 08/29/2023) for annual examination.     Leilani Merl, FNP, have reviewed all documentation for this visit. The documentation on 02/26/23 for the exam, diagnosis, procedures, and orders are all accurate and complete.  Jacky Kindle, FNP  [facility] [phone] (phone) [phone] (fax)  [facility]

## 2023-02-26 NOTE — Assessment & Plan Note (Signed)
Chronic, variable readings with low TSH previously On 112 synthroid Repeat labs Pt without complaints

## 2023-02-27 LAB — CBC
Hematocrit: 44.9 % (ref 34.0–46.6)
Hemoglobin: 13.8 g/dL (ref 11.1–15.9)
MCH: 21.2 pg — ABNORMAL LOW (ref 26.6–33.0)
MCHC: 30.7 g/dL — ABNORMAL LOW (ref 31.5–35.7)
MCV: 69 fL — ABNORMAL LOW (ref 79–97)
Platelets: 440 10*3/uL (ref 150–450)
RBC: 6.52 x10E6/uL — ABNORMAL HIGH (ref 3.77–5.28)
RDW: 17.7 % — ABNORMAL HIGH (ref 11.7–15.4)
WBC: 5 10*3/uL (ref 3.4–10.8)

## 2023-02-27 LAB — BASIC METABOLIC PANEL
BUN/Creatinine Ratio: 13 (ref 9–23)
BUN: 12 mg/dL (ref 6–24)
CO2: 20 mmol/L (ref 20–29)
Calcium: 9.7 mg/dL (ref 8.7–10.2)
Chloride: 102 mmol/L (ref 96–106)
Creatinine, Ser: 0.95 mg/dL (ref 0.57–1.00)
Glucose: 87 mg/dL (ref 70–99)
Potassium: 4.6 mmol/L (ref 3.5–5.2)
Sodium: 140 mmol/L (ref 134–144)
eGFR: 72 mL/min/{1.73_m2} (ref >59)

## 2023-02-27 LAB — HEMOGLOBIN A1C
Est. average glucose Bld gHb Est-mCnc: 120 mg/dL
Hgb A1c MFr Bld: 5.8 % — ABNORMAL HIGH (ref 4.8–5.6)

## 2023-02-27 LAB — LIPID PANEL
Chol/HDL Ratio: 5.4 ratio — ABNORMAL HIGH (ref 0.0–4.4)
Cholesterol, Total: 253 mg/dL — ABNORMAL HIGH (ref 100–199)
HDL: 47 mg/dL (ref >39)
LDL Chol Calc (NIH): 169 mg/dL — ABNORMAL HIGH (ref 0–99)
Triglycerides: 201 mg/dL — ABNORMAL HIGH (ref 0–149)
VLDL Cholesterol Cal: 37 mg/dL (ref 5–40)

## 2023-02-27 LAB — TSH: TSH: 2.62 u[IU]/mL (ref 0.450–4.500)

## 2023-03-24 ENCOUNTER — Ambulatory Visit: Payer: BC Managed Care – PPO

## 2023-05-26 ENCOUNTER — Other Ambulatory Visit: Payer: Self-pay | Admitting: Physician Assistant

## 2023-05-26 DIAGNOSIS — J301 Allergic rhinitis due to pollen: Secondary | ICD-10-CM

## 2023-05-26 DIAGNOSIS — H9311 Tinnitus, right ear: Secondary | ICD-10-CM

## 2023-05-27 ENCOUNTER — Encounter: Payer: Self-pay | Admitting: Family Medicine

## 2023-05-27 ENCOUNTER — Telehealth: Payer: Self-pay | Admitting: Family Medicine

## 2023-05-27 ENCOUNTER — Ambulatory Visit: Payer: BC Managed Care – PPO | Admitting: Family Medicine

## 2023-05-27 DIAGNOSIS — R053 Chronic cough: Secondary | ICD-10-CM

## 2023-05-27 MED ORDER — PHENTERMINE HCL 37.5 MG PO CAPS: 37.5000 mg | ORAL_CAPSULE | ORAL | 0 refills | Status: DC

## 2023-05-27 NOTE — Telephone Encounter (Signed)
Requested Prescriptions  Pending Prescriptions Disp Refills   loratadine (CLARITIN) 10 MG tablet [Pharmacy Med Name: Loratadine 10 MG Oral Tablet] 90 tablet 0    Sig: Take 1 tablet by mouth once daily     Ear, Nose, and Throat:  Antihistamines 2 Passed - 05/26/2023  6:50 AM      Passed - Cr in normal range and within 360 days    Creatinine  Date Value Ref Range Status  04/12/2014 0.89 0.60 - 1.30 mg/dL Final   Creatinine, Ser  Date Value Ref Range Status  02/26/2023 0.95 0.57 - 1.00 mg/dL Final         Passed - Valid encounter within last 12 months    Recent Outpatient Visits           3 months ago Morbid obesity [facility])   [place] [facility] Merita Norton T, FNP   8 months ago Primary hypertension   [place] [facility] Alfredia Ferguson, PA-C   10 months ago Primary hypertension   [place] [facility] Alfredia Ferguson, PA-C   1 year ago Primary hypertension   [place] [facility] Alfredia Ferguson, PA-C   1 year ago Primary hypertension   [place] [facility] Alfredia Ferguson, [place]       Future Appointments             Today Jacky Kindle, FNP [facility]

## 2023-05-27 NOTE — Telephone Encounter (Signed)
Received fax from [employer] for Phentermine HCI 37.5 mg.   Key:  [name]

## 2023-05-27 NOTE — Progress Notes (Signed)
Established patient visit   Patient: Laura Wilkinson   DOB: 10/24/1970   52 y.o. Female  MRN: [id number] Visit Date: 05/27/2023  Today's healthcare provider: Jacky Kindle, FNP  Re Introduced to nurse practitioner role and practice setting.  All questions answered.  Discussed provider/patient relationship and expectations.2  Chief Complaint  Patient presents with   Weight Management Screening    Pt requesting phentermine   Subjective    HPI HPI     Weight Management Screening    Additional comments: Pt requesting phentermine      Last edited by Shelly Bombard, CMA on 05/27/2023  1:20 PM.      The patient, with a history of prediabetes, hyperlipidemia, and hypothyroidism, presents for a follow-up visit. They express a desire to restart phentermine for weight management, having previously taken it years ago with only a slight headache as a side effect. They report no changes in their weight over the past three months, remaining at 250 lbs.  The patient has been managing their cholesterol with lovastatin and has recently added cholestyramine to their regimen. Despite these efforts, their cholesterol level has slightly increased to 169. Their prediabetes has also slightly worsened, with their HbA1c increasing from 5.6 to 5.8 over the past few months.  In addition to these concerns, the patient has been experiencing a persistent cough since recovering from COVID-19 a couple of months ago. They describe the sensation as "phlegm sitting right here," indicating their throat, and frequently clear their throat to try to alleviate the sensation. They deny any difficulty swallowing or changes in their diet.  The patient also mentions a runny nose, which they attribute to allergies and possibly to wearing a mask. They express concern about potentially spreading illness to their mother, who is on oxygen.  Medications: Outpatient Medications Prior to Visit  Medication Sig   levothyroxine  (SYNTHROID) 112 MCG tablet Take 1 tablet (112 mcg total) by mouth daily.   loratadine (CLARITIN) 10 MG tablet Take 10 mg by mouth daily.   losartan (COZAAR) 100 MG tablet Take 1 tablet by mouth once daily   lovastatin (MEVACOR) 20 MG tablet TAKE 1 TABLET BY MOUTH AT BEDTIME   No facility-administered medications prior to visit.      Objective    BP 129/84 (BP Location: Right Arm, Patient Position: Sitting, Cuff Size: Normal)   Pulse 69   Ht 5\' 4"  (1.626 m)   Wt 250 lb (113.4 kg)   BMI 42.91 kg/m   Physical Exam Vitals and nursing note reviewed.  Constitutional:      General: She is not in acute distress.    Appearance: Normal appearance. She is obese. She is not ill-appearing, toxic-appearing or diaphoretic.  HENT:     Head: Normocephalic and atraumatic.  Cardiovascular:     Rate and Rhythm: Normal rate and regular rhythm.     Pulses: Normal pulses.     Heart sounds: Normal heart sounds. No murmur heard.    No friction rub. No gallop.  Pulmonary:     Effort: Pulmonary effort is normal. No respiratory distress.     Breath sounds: Normal breath sounds. No stridor. No wheezing, rhonchi or rales.  Chest:     Chest wall: No tenderness.  Musculoskeletal:        General: No swelling, tenderness, deformity or signs of injury. Normal range of motion.     Right lower leg: No edema.     Left lower leg: No  edema.  Skin:    General: Skin is warm and dry.     Capillary Refill: Capillary refill takes less than 2 seconds.     Coloration: Skin is not jaundiced or pale.     Findings: No bruising, erythema, lesion or rash.  Neurological:     General: No focal deficit present.     Mental Status: She is alert and oriented to person, place, and time. Mental status is at baseline.     Cranial Nerves: No cranial nerve deficit.     Sensory: No sensory deficit.     Motor: No weakness.     Coordination: Coordination normal.  Psychiatric:        Mood and Affect: Mood normal.        Behavior:  Behavior normal.        Thought Content: Thought content normal.        Judgment: Judgment normal.     No results found for any visits on 05/27/23.  Assessment & Plan     Problem List Items Addressed This Visit       Other   Chronic cough   Morbid obesity (HCC) - Primary   Relevant Medications   phentermine 37.5 MG capsule   Weight Management Stable weight at 250 lbs. Patient has previously used Phentermine with only mild side effects (headache). -Start Phentermine for weight management. -Plan to reassess in 3 months.  Hypertension Blood pressure improved from 136/88 to 129/84. -Continue current management.  Hyperlipidemia Slight increase in cholesterol to 169. Patient has added cholesterol to diet. -Continue Lovastatin. -Plan to recheck cholesterol in 3 months.  Prediabetes A1C increased from 5.6 to 5.8. -Continue current management.  Chronic Cough Persistent cough since recent COVID-19 infection. No signs of reflux or difficulty swallowing. -Continue monitoring symptoms.  General Health Maintenance -Plan to follow up in 3 months.  Return in about 3 months (around 08/27/2023) for chonic disease management- weight mgmt.     Leilani Merl, FNP, have reviewed all documentation for this visit. The documentation on 05/27/23 for the exam, diagnosis, procedures, and orders are all accurate and complete.  Jacky Kindle, FNP  [facility] [phone] (phone) [phone] (fax)  [facility]

## 2023-05-28 NOTE — Telephone Encounter (Signed)
Archived on November 14 by [id number] [facility]

## 2023-06-27 ENCOUNTER — Other Ambulatory Visit: Payer: Self-pay | Admitting: Physician Assistant

## 2023-06-27 DIAGNOSIS — I10 Essential (primary) hypertension: Secondary | ICD-10-CM

## 2023-06-28 NOTE — Telephone Encounter (Signed)
Requested Prescriptions  Pending Prescriptions Disp Refills   losartan (COZAAR) 100 MG tablet [Pharmacy Med Name: Losartan Potassium 100 MG Oral Tablet] 90 tablet 0    Sig: Take 1 tablet by mouth once daily     Cardiovascular:  Angiotensin Receptor Blockers Passed - 06/28/2023  3:27 PM      Passed - Cr in normal range and within 180 days    Creatinine  Date Value Ref Range Status  04/12/2014 0.89 0.60 - 1.30 mg/dL Final   Creatinine, Ser  Date Value Ref Range Status  02/26/2023 0.95 0.57 - 1.00 mg/dL Final         Passed - K in normal range and within 180 days    Potassium  Date Value Ref Range Status  02/26/2023 4.6 3.5 - 5.2 mmol/L Final  04/12/2014 3.9 3.5 - 5.1 mmol/L Final         Passed - Patient is not pregnant      Passed - Last BP in normal range    BP Readings from Last 1 Encounters:  05/27/23 129/84         Passed - Valid encounter within last 6 months    Recent Outpatient Visits           1 month ago Morbid obesity [facility])   [place] [facility] Jacky Kindle, FNP   4 months ago Morbid obesity [facility])   [place] [facility] Jacky Kindle, FNP   9 months ago Primary hypertension   [place] [facility] Alfredia Ferguson, PA-C   12 months ago Primary hypertension   [place] [facility] Alfredia Ferguson, PA-C   1 year ago Primary hypertension   [place] [facility] Alfredia Ferguson, PA-C       Future Appointments             In 2 months Jacky Kindle, FNP [facility], [facility]

## 2023-07-21 IMAGING — MG MM DIGITAL SCREENING BILAT W/ TOMO AND CAD
8 series · 8 of 24 positions shown · non-contrast
Comparison: Previous exam(s).

CLINICAL DATA: Screening.

EXAM:
DIGITAL SCREENING BILATERAL MAMMOGRAM WITH TOMOSYNTHESIS AND CAD
TECHNIQUE: Bilateral screening digital craniocaudal and mediolateral oblique
mammograms were obtained. Bilateral screening digital breast
tomosynthesis was performed. The images were evaluated with
computer-aided detection.

[L CC synth-2D]
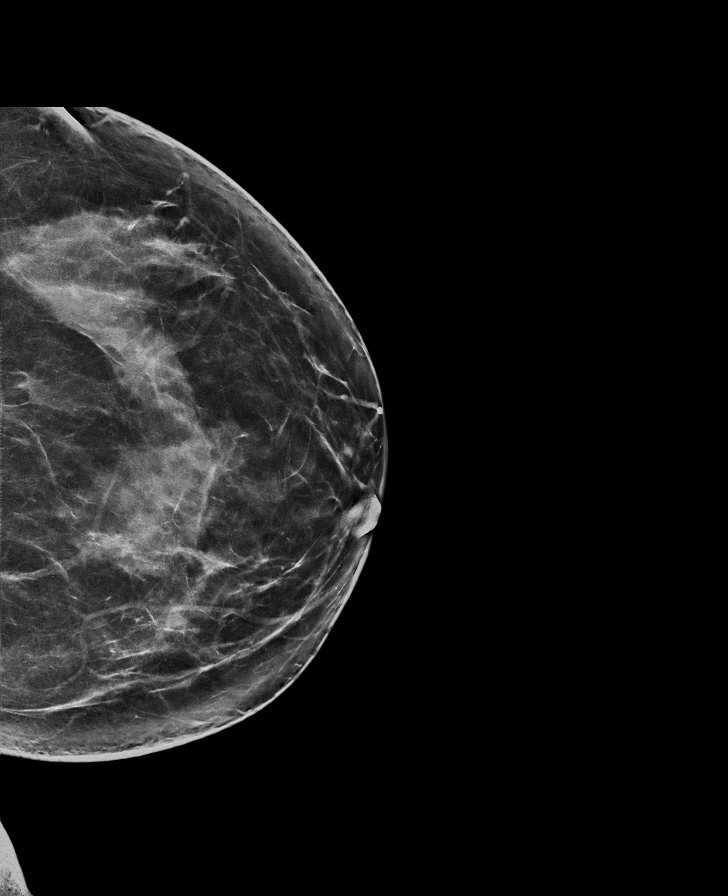

[L MLO synth-2D]
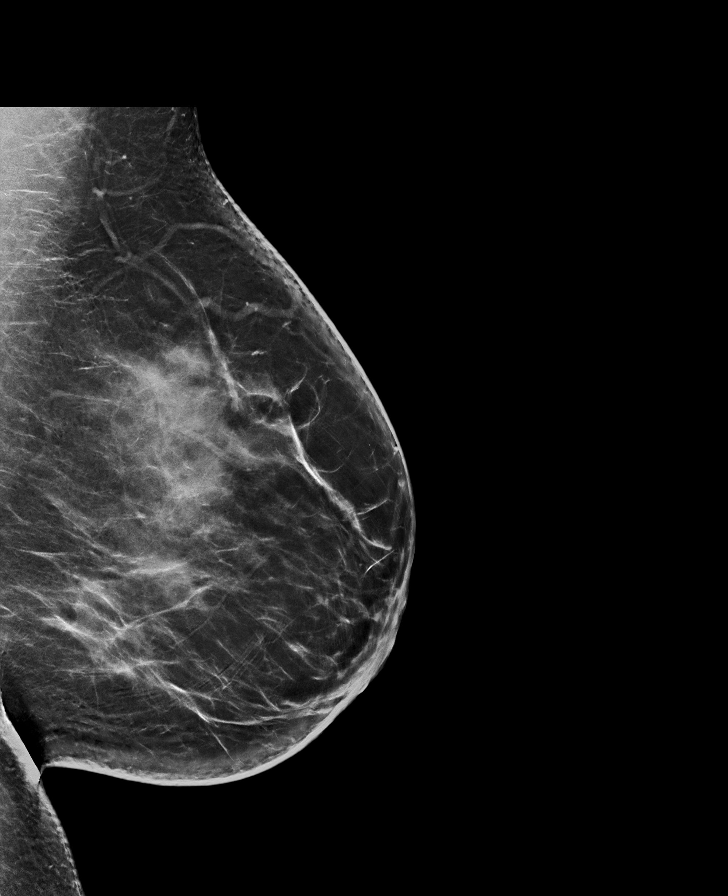

[R MLO synth-2D]
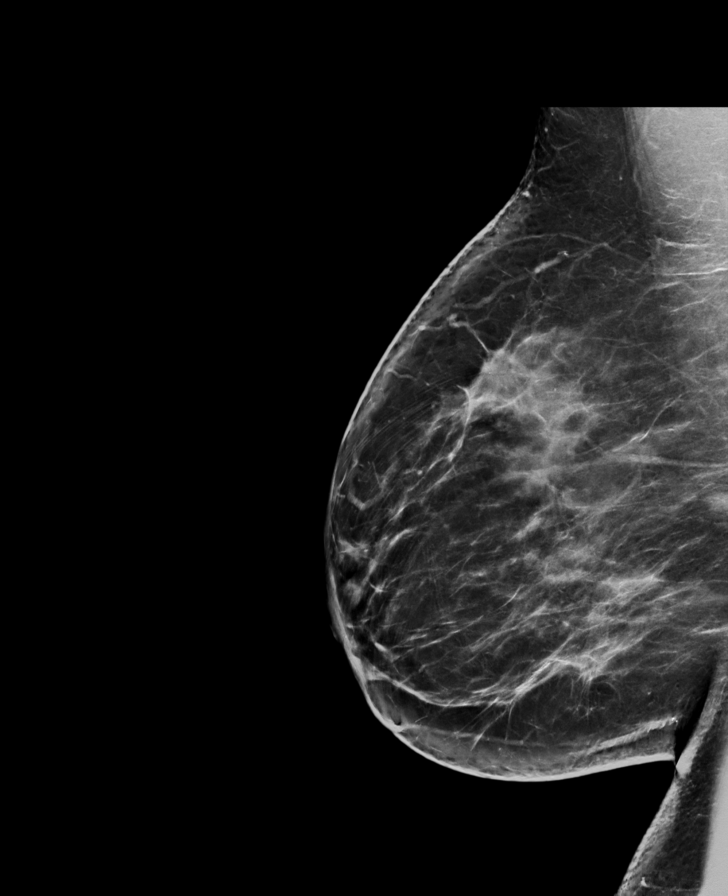

[R CC synth-2D]
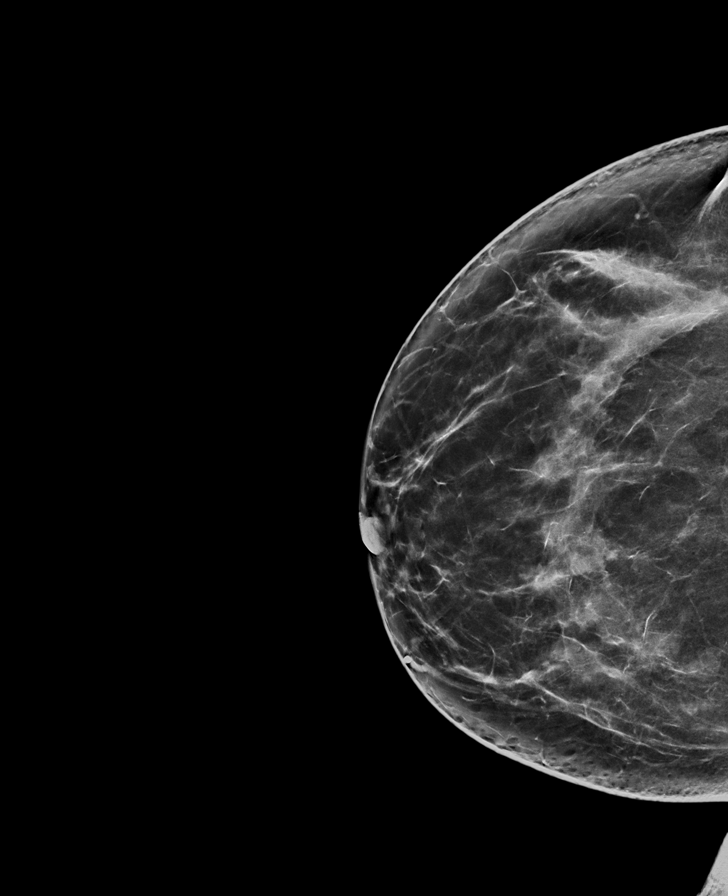

[L MLO tomo · tomo slice 45/89.0]
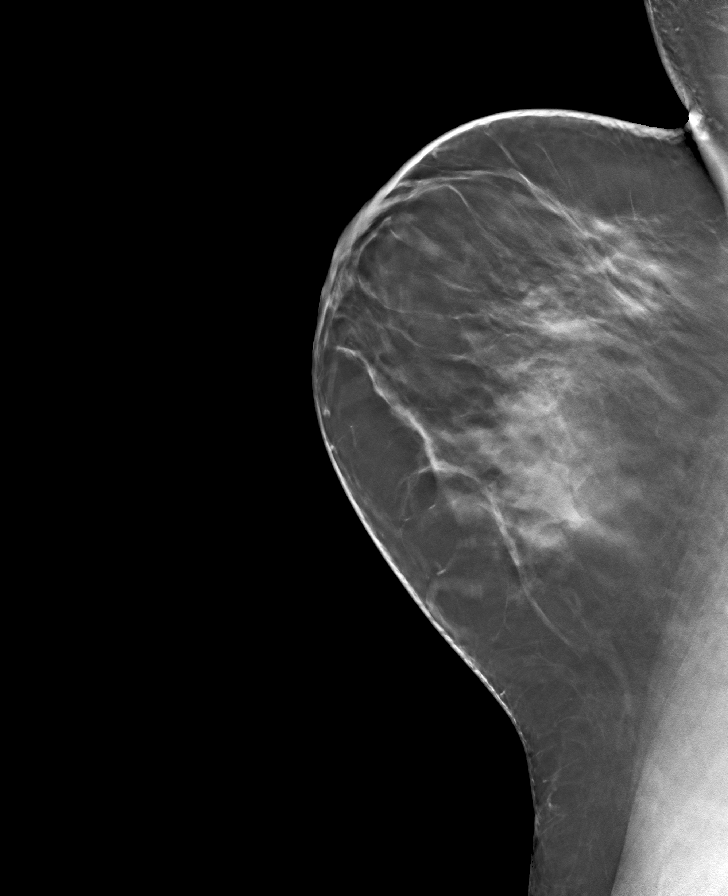

[R MLO tomo · tomo slice 46/91.0]
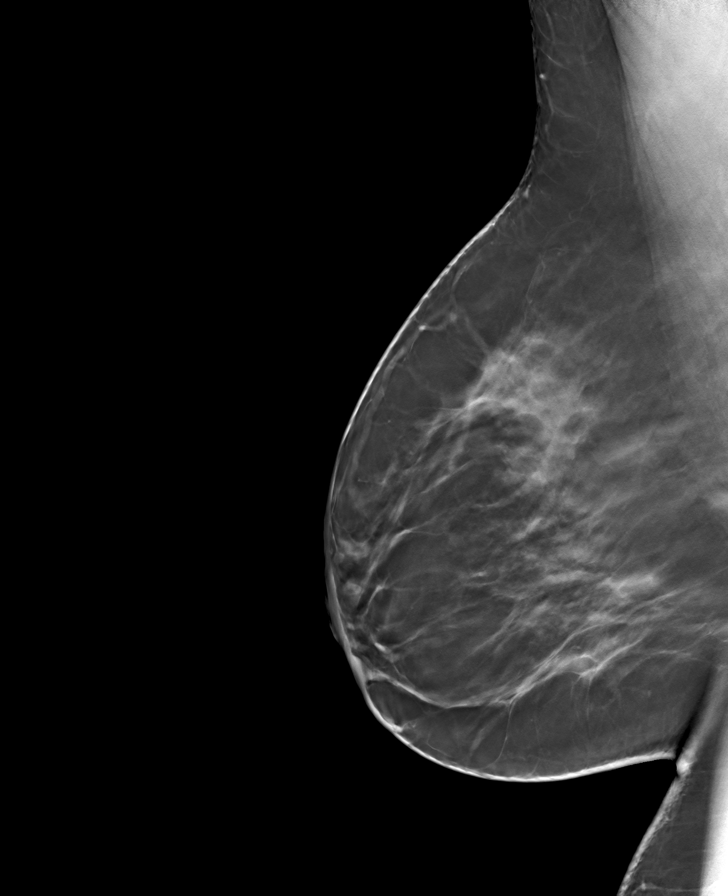

[L CC tomo · tomo slice 41/80.0]
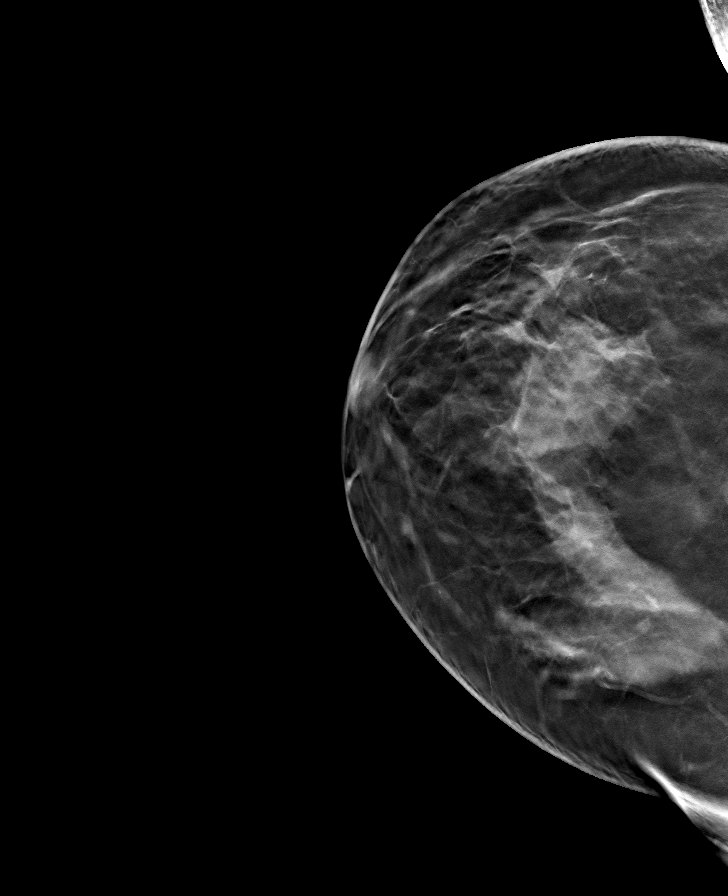

[R CC tomo · tomo slice 40/79.0]
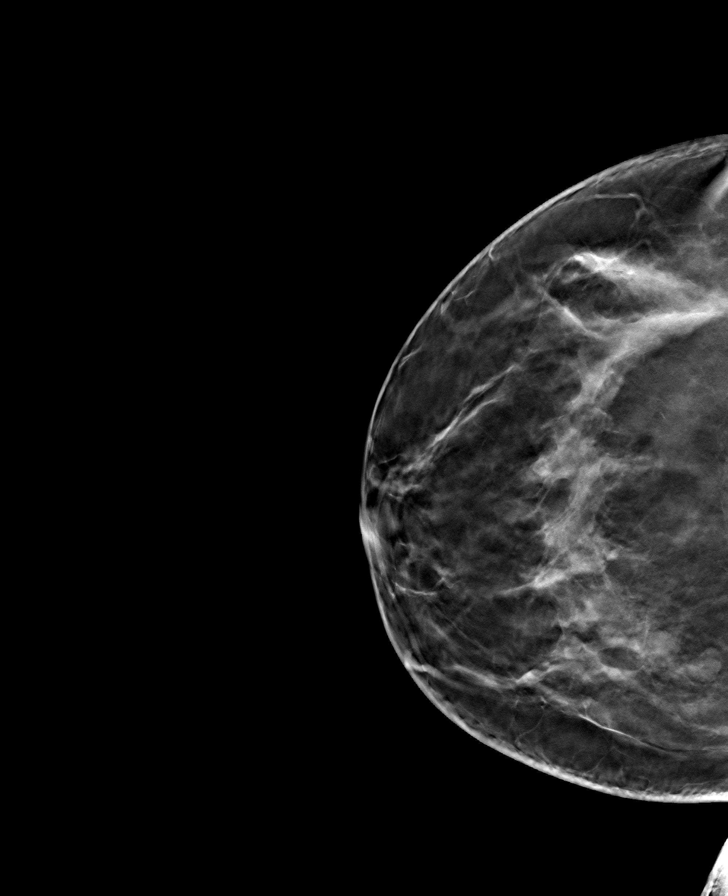

[8 of 24 positions shown; findings below may reference images not displayed]

ACR Breast Density Category b: There are scattered areas of
fibroglandular density.
FINDINGS: There are no findings suspicious for malignancy.
IMPRESSION: No mammographic evidence of malignancy. A result letter of this
screening mammogram will be mailed directly to the patient.

RECOMMENDATION:
Screening mammogram in one year. (Code:51-O-LD2)

BI-RADS CATEGORY  1: Negative.

## 2023-08-25 ENCOUNTER — Other Ambulatory Visit: Payer: Self-pay | Admitting: Physician Assistant

## 2023-08-25 DIAGNOSIS — E039 Hypothyroidism, unspecified: Secondary | ICD-10-CM

## 2023-08-25 NOTE — Telephone Encounter (Signed)
Requested Prescriptions  Pending Prescriptions Disp Refills   levothyroxine (SYNTHROID) 112 MCG tablet [Pharmacy Med Name: Levothyroxine Sodium 112 MCG Oral Tablet] 90 tablet 0    Sig: Take 1 tablet by mouth once daily     Endocrinology:  Hypothyroid Agents Passed - 08/25/2023  3:51 PM      Passed - TSH in normal range and within 360 days    TSH  Date Value Ref Range Status  02/26/2023 2.620 0.450 - 4.500 uIU/mL Final         Passed - Valid encounter within last 12 months    Recent Outpatient Visits           3 months ago Morbid obesity [facility])   [place] [facility] Merita Norton T, FNP   6 months ago Morbid obesity [facility])   [place] [facility] Jacky Kindle, FNP   11 months ago Primary hypertension   [place] [facility] Alfredia Ferguson, PA-C   1 year ago Primary hypertension   [place] [facility] Alfredia Ferguson, PA-C   1 year ago Primary hypertension   [place] [facility] Alfredia Ferguson, PA-C       Future Appointments             In 2 days Sallee Provencal, FNP [facility], [facility]

## 2023-08-27 ENCOUNTER — Encounter: Payer: Self-pay | Admitting: Family Medicine

## 2023-08-27 ENCOUNTER — Ambulatory Visit: Payer: BC Managed Care – PPO | Admitting: Family Medicine

## 2023-08-27 ENCOUNTER — Ambulatory Visit: Payer: 59 | Admitting: Family Medicine

## 2023-08-27 VITALS — BP 148/97 | HR 72 | Ht 63.0 in | Wt 243.1 lb

## 2023-08-27 DIAGNOSIS — E890 Postprocedural hypothyroidism: Secondary | ICD-10-CM | POA: Diagnosis not present

## 2023-08-27 DIAGNOSIS — I10 Essential (primary) hypertension: Secondary | ICD-10-CM

## 2023-08-27 DIAGNOSIS — K219 Gastro-esophageal reflux disease without esophagitis: Secondary | ICD-10-CM | POA: Insufficient documentation

## 2023-08-27 DIAGNOSIS — E785 Hyperlipidemia, unspecified: Secondary | ICD-10-CM | POA: Diagnosis not present

## 2023-08-27 DIAGNOSIS — Z6841 Body Mass Index (BMI) 40.0 and over, adult: Secondary | ICD-10-CM

## 2023-08-27 DIAGNOSIS — R7303 Prediabetes: Secondary | ICD-10-CM

## 2023-08-27 MED ORDER — OMEPRAZOLE 40 MG PO CPDR
40.0000 mg | DELAYED_RELEASE_CAPSULE | Freq: Every day | ORAL | 1 refills | Status: AC
Start: 2023-08-27 — End: ?

## 2023-08-27 NOTE — Assessment & Plan Note (Signed)
Appears chronic, and worsening Denies nausea, vomiting, or difficulty swallowing Reports frequent acid reflux symptoms, currently using antacids, rolaids, and pepcid as needed. -Start omeprazole 40mg  for 8 weeks, then transition back to daily Pepcid

## 2023-08-27 NOTE — Assessment & Plan Note (Addendum)
Elevated blood pressure today (148/97), likely secondary to phentermine use.  Previously well-controlled on losartan 100mg  daily. -Discontinue phentermine due to hypertensive effect. -Recheck blood pressure in 1 month. -low sodium diet, continue to work on weight loss and exercise daily -monitor at home with upper arm cuff

## 2023-08-27 NOTE — Assessment & Plan Note (Signed)
Chronic, slight improvement Previously PCP prescribed phentermine 37.5mg  daily - pt lost 7lbs since November, but HTN worsened Stop phentermine d/t BP Repeat labs Discussed importance of healthy weight management Discussed diet and exercise Referral to Nutrition

## 2023-08-27 NOTE — Progress Notes (Signed)
Established Patient Office Visit  Introduced to nurse practitioner role and practice setting.  All questions answered.  Discussed provider/patient relationship and expectations.  Subjective   Patient ID: Laura Wilkinson, female    DOB: [birth year]  Age: 53 y.o. MRN: [id number]  Chief Complaint  Patient presents with   Follow-up    3 month follow up    Laura Wilkinson is a 53 year old female who presents for a three-month follow-up.  She has experienced a weight loss of seven pounds over the past three months, currently weighing 243 pounds, down from 250 pounds. She has been taking phentermine 37.5 mg for the past three months, which she believes may be contributing to her elevated blood pressure today.  Her blood pressure is elevated at 148/97 mmHg, whereas it was previously well-controlled on losartan 100 mg daily. She suspects that phentermine may be contributing to the increase in blood pressure.  She experiences frequent acid reflux, particularly after meals and sometimes at night, which she manages with Rolaids, Tums, and occasionally Pepcid. No difficulty swallowing or vomiting.  Her diet is light, consisting of meals like hot dogs and subs, and she drinks decaf coffee. She has not previously consulted a nutritionist or dietician, especially considering her prediabetes diagnosis. She is unsure about her insurance coverage for weight loss medications.  She is currently taking lovastatin 20 mg for hyperlipidemia and Synthroid 112 mcg for hypothyroidism, which she takes due to having no thyroid post-surgery.  She takes Claritin as needed for seasonal allergies.        08/27/2023    8:54 AM 05/27/2023    1:22 PM 02/26/2023    8:35 AM  Depression screen PHQ 2/9  Decreased Interest 0 0 0  Down, Depressed, Hopeless 0 0 0  PHQ - 2 Score 0 0 0  Altered sleeping 0 0 0  Tired, decreased energy 0 0 0  Change in appetite 0 0 0  Feeling bad or failure about yourself  0 0 0  Trouble  concentrating 0 0 0  Moving slowly or fidgety/restless 0 0 0  Suicidal thoughts 0 0 0  PHQ-9 Score 0 0 0  Difficult doing work/chores  Not difficult at all Not difficult at all       08/27/2023    8:54 AM 05/27/2023    1:22 PM  GAD 7 : Generalized Anxiety Score  Nervous, Anxious, on Edge 0 0  Control/stop worrying 0 0  Worry too much - different things 0 0  Trouble relaxing 0 0  Restless 0 0  Easily annoyed or irritable 0 0  Afraid - awful might happen 0 0  Total GAD 7 Score 0 0  Anxiety Difficulty  Not difficult at all     Review of Systems  All other systems reviewed and are negative.   Negative unless indicated in HPI   Objective:     BP (!) 148/97   Pulse 72   Ht 5\' 3"  (1.6 m)   Wt 243 lb 1.6 oz (110.3 kg)   SpO2 99%   BMI 43.06 kg/m    Physical Exam Constitutional:      General: She is not in acute distress.    Appearance: Normal appearance. She is obese. She is not toxic-appearing or diaphoretic.  HENT:     Head: Normocephalic.     Nose: Nose normal.     Mouth/Throat:     Mouth: Mucous membranes are moist.     Pharynx: Oropharynx is clear.  Eyes:     Extraocular Movements: Extraocular movements intact.     Pupils: Pupils are equal, round, and reactive to light.  Neck:     Vascular: No carotid bruit.  Cardiovascular:     Rate and Rhythm: Normal rate and regular rhythm.     Pulses: Normal pulses.     Heart sounds: Normal heart sounds. No murmur heard.    No friction rub. No gallop.  Pulmonary:     Effort: No respiratory distress.     Breath sounds: No stridor. No wheezing, rhonchi or rales.  Chest:     Chest wall: No tenderness.  Musculoskeletal:     Right lower leg: No edema.     Left lower leg: No edema.  Lymphadenopathy:     Cervical: No cervical adenopathy.  Skin:    General: Skin is warm and dry.     Capillary Refill: Capillary refill takes less than 2 seconds.  Neurological:     General: No focal deficit present.     Mental Status:  She is alert and oriented to person, place, and time. Mental status is at baseline.  Psychiatric:        Mood and Affect: Mood normal.        Behavior: Behavior normal.        Thought Content: Thought content normal.        Judgment: Judgment normal.    No results found for any visits on 08/27/23.    The 10-year ASCVD risk score (Arnett DK, et al., 2019) is: 10.7%    Assessment & Plan:  Primary hypertension Assessment & Plan: Elevated blood pressure today (148/97), likely secondary to phentermine use.  Previously well-controlled on losartan 100mg  daily. -Discontinue phentermine due to hypertensive effect. -Recheck blood pressure in 1 month. 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 -Continue Synthroid daily. -Recheck thyroid function with labs today.  Orders: -     TSH + free T4  Dyslipidemia Assessment & Plan: -Continue lovastatin 20mg  daily. -Recheck lipid panel with labs today. -Be aware of saturated fats and processed foods   Orders: -     Lipid panel  Prediabetes Assessment & Plan: Chronic, stable Last A1C = 5.8 Will recheck today Continue efforts in conscious food choices, smaller portions, water drink of choice Purposeful exerise Referral to Nutrition  Orders: -     Hemoglobin A1c -     Referral to Nutrition and Diabetes Services  Morbid obesity (HCC) Assessment & Plan: Chronic, slight improvement Previously PCP prescribed phentermine 37.5mg  daily - pt lost 7lbs since November, but HTN worsened Stop phentermine d/t BP Repeat labs Discussed importance of healthy weight management Discussed diet and exercise Referral to Nutrition  Orders: -     Referral to Nutrition and Diabetes Services  Gastroesophageal reflux disease without  esophagitis Assessment & Plan: Appears chronic, and worsening Denies nausea, vomiting, or difficulty swallowing Reports frequent acid reflux symptoms, currently using antacids, rolaids, and pepcid as needed. -Start omeprazole 40mg  for 8 weeks, then transition back to daily Pepcid  Orders: -     Omeprazole; Take 1 capsule (40 mg total) by mouth daily.  Dispense: 30 capsule; Refill: 1   Return in about 1 month (around 09/24/2023) for BP check post stopping phentermine .   I, Sallee Provencal,  FNP, have reviewed all documentation for this visit. The documentation on 08/27/23 for the exam, diagnosis, procedures, and orders are all accurate and complete.   Sallee Provencal, FNP

## 2023-08-27 NOTE — Assessment & Plan Note (Signed)
-  Continue lovastatin 20mg  daily. -Recheck lipid panel with labs today. -Be aware of saturated fats and processed foods

## 2023-08-27 NOTE — Assessment & Plan Note (Signed)
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 -Continue Synthroid daily. -Recheck thyroid function with labs today.

## 2023-08-27 NOTE — Assessment & Plan Note (Signed)
Chronic, stable Last A1C = 5.8 Will recheck today Continue efforts in conscious food choices, smaller portions, water drink of choice Purposeful exerise Referral to Nutrition

## 2023-08-28 LAB — LIPID PANEL
Chol/HDL Ratio: 4.4 {ratio} (ref 0.0–4.4)
Cholesterol, Total: 233 mg/dL — ABNORMAL HIGH (ref 100–199)
HDL: 53 mg/dL (ref >39)
LDL Chol Calc (NIH): 161 mg/dL — ABNORMAL HIGH (ref 0–99)
Triglycerides: 106 mg/dL (ref 0–149)
VLDL Cholesterol Cal: 19 mg/dL (ref 5–40)

## 2023-08-28 LAB — COMPREHENSIVE METABOLIC PANEL
ALT: 18 [IU]/L (ref 0–32)
AST: 15 [IU]/L (ref 0–40)
Albumin: 4.5 g/dL (ref 3.8–4.9)
Alkaline Phosphatase: 67 [IU]/L (ref 44–121)
BUN/Creatinine Ratio: 13 (ref 9–23)
BUN: 12 mg/dL (ref 6–24)
Bilirubin Total: 0.3 mg/dL (ref 0.0–1.2)
CO2: 23 mmol/L (ref 20–29)
Calcium: 9 mg/dL (ref 8.7–10.2)
Chloride: 104 mmol/L (ref 96–106)
Creatinine, Ser: 0.93 mg/dL (ref 0.57–1.00)
Globulin, Total: 2.6 g/dL (ref 1.5–4.5)
Glucose: 90 mg/dL (ref 70–99)
Potassium: 4.7 mmol/L (ref 3.5–5.2)
Sodium: 140 mmol/L (ref 134–144)
Total Protein: 7.1 g/dL (ref 6.0–8.5)
eGFR: 73 mL/min/{1.73_m2} (ref >59)

## 2023-08-28 LAB — HEMOGLOBIN A1C
Est. average glucose Bld gHb Est-mCnc: 114 mg/dL
Hgb A1c MFr Bld: 5.6 % (ref 4.8–5.6)

## 2023-08-28 LAB — TSH+FREE T4
Free T4: 1.54 ng/dL (ref 0.82–1.77)
TSH: 1.9 u[IU]/mL (ref 0.450–4.500)

## 2023-08-30 ENCOUNTER — Encounter: Payer: Self-pay | Admitting: Family Medicine

## 2023-09-28 ENCOUNTER — Ambulatory Visit: Payer: 59 | Admitting: Family Medicine

## 2023-09-29 ENCOUNTER — Ambulatory Visit: Admitting: Family Medicine

## 2023-09-29 ENCOUNTER — Encounter: Payer: Self-pay | Admitting: Family Medicine

## 2023-09-29 VITALS — BP 130/93 | HR 78 | Ht 63.0 in | Wt 244.9 lb

## 2023-09-29 DIAGNOSIS — I10 Essential (primary) hypertension: Secondary | ICD-10-CM

## 2023-09-29 MED ORDER — LOSARTAN POTASSIUM-HCTZ 100-12.5 MG PO TABS: 1.0000 | ORAL_TABLET | Freq: Every day | ORAL | 1 refills | Status: DC

## 2023-09-29 NOTE — Assessment & Plan Note (Signed)
 Chronic, elevated Systolic improved, diastolic remains elevated.  Home readings DBP consisting 90s to 100s  Options discussed  Recommended losartan/hydrochlorothiazide combination for better adherence.  Emphasized lifestyle modifications - Prescribe losartan/hydrochlorothiazide combination pill 100/12.5mg  daily  - Discontinue current losartan upon starting combination pill. - f/u 4-6 weeks to assess response. - Continue lifestyle modifications, monitor salt intake.

## 2023-09-29 NOTE — Progress Notes (Signed)
 Established Patient Office Visit  Introduced to nurse practitioner role and practice setting.  All questions answered.  Discussed provider/patient relationship and expectations.   Subjective   Patient ID: Laura Wilkinson, female    DOB: 1970-10-29  Age: 53 y.o. MRN: [id number]  Chief Complaint  Patient presents with   Follow-up    4 wk follow up for BP check   Laura Wilkinson is a 53 year old female with hypertension who presents for a blood pressure check.  She notes that her home blood pressure readings show diastolic pressures consistently high, ranging from the nineties to the hundreds, while systolic pressures average between 130 to 150.  She is currently on losartan 100mg . Today 134/94, which is an improvement from a previous reading of 148/97 after stopping phentermine. No vision changes or significant headaches, only experiencing 'normal headaches' related to sinuses. She has been trying to manage her condition through lifestyle changes, including avoiding caffeine and exercising, but finds it challenging to lower the diastolic pressure.        09/29/2023    2:36 PM 08/27/2023    8:54 AM 05/27/2023    1:22 PM  Depression screen PHQ 2/9  Decreased Interest 0 0 0  Down, Depressed, Hopeless 0 0 0  PHQ - 2 Score 0 0 0  Altered sleeping 0 0 0  Tired, decreased energy 0 0 0  Change in appetite 0 0 0  Feeling bad or failure about yourself  0 0 0  Trouble concentrating 0 0 0  Moving slowly or fidgety/restless 0 0 0  Suicidal thoughts 0 0 0  PHQ-9 Score 0 0 0  Difficult doing work/chores Not difficult at all  Not difficult at all       09/29/2023    2:36 PM 08/27/2023    8:54 AM 05/27/2023    1:22 PM  GAD 7 : Generalized Anxiety Score  Nervous, Anxious, on Edge 0 0 0  Control/stop worrying 0 0 0  Worry too much - different things 0 0 0  Trouble relaxing 0 0 0  Restless 0 0 0  Easily annoyed or irritable 0 0 0  Afraid - awful might happen 0 0 0  Total GAD 7 Score 0 0 0   Anxiety Difficulty Not difficult at all  Not difficult at all     Review of Systems  All other systems reviewed and are negative.   Negative unless indicated in HPI   Objective:     BP (!) 130/93   Pulse 78   Ht 5\' 3"  (1.6 m)   Wt 244 lb 14.4 oz (111.1 kg)   SpO2 100%   BMI 43.38 kg/m    Physical Exam Constitutional:      General: She is not in acute distress.    Appearance: Normal appearance. She is obese. She is not toxic-appearing or diaphoretic.  HENT:     Head: Normocephalic.     Nose: Nose normal.     Mouth/Throat:     Mouth: Mucous membranes are moist.     Pharynx: Oropharynx is clear.  Eyes:     Extraocular Movements: Extraocular movements intact.     Pupils: Pupils are equal, round, and reactive to light.  Cardiovascular:     Rate and Rhythm: Normal rate and regular rhythm.     Pulses: Normal pulses.     Heart sounds: Normal heart sounds. No murmur heard.    No friction rub. No gallop.  Pulmonary:  Effort: Pulmonary effort is normal. No respiratory distress.     Breath sounds: Normal breath sounds. No stridor. No wheezing, rhonchi or rales.  Chest:     Chest wall: No tenderness.  Abdominal:     Tenderness: There is no right CVA tenderness or left CVA tenderness.  Musculoskeletal:     Right lower leg: No edema.     Left lower leg: No edema.  Skin:    General: Skin is warm and dry.     Capillary Refill: Capillary refill takes less than 2 seconds.  Neurological:     General: No focal deficit present.     Mental Status: She is alert and oriented to person, place, and time. Mental status is at baseline.     Cranial Nerves: No cranial nerve deficit.     Sensory: No sensory deficit.     Motor: No weakness.     Coordination: Coordination normal.     Gait: Gait normal.  Psychiatric:        Mood and Affect: Mood normal.        Behavior: Behavior normal.        Thought Content: Thought content normal.        Judgment: Judgment normal.     No  results found for any visits on 09/29/23.    The 10-year ASCVD risk score (Arnett DK, et al., 2019) is: 5.4%    Assessment & Plan:  Primary hypertension Assessment & Plan: Chronic, elevated Systolic improved, diastolic remains elevated.  Home readings DBP consisting 90s to 100s  Options discussed  Recommended losartan/hydrochlorothiazide combination for better adherence.  Emphasized lifestyle modifications - Prescribe losartan/hydrochlorothiazide combination pill 100/12.5mg  daily  - Discontinue current losartan upon starting combination pill. - f/u 4-6 weeks to assess response. - Continue lifestyle modifications, monitor salt intake.  Orders: -     Losartan Potassium-HCTZ; Take 1 tablet by mouth daily.  Dispense: 90 tablet; Refill: 1     Return in about 1 month (around 10/30/2023) for BP check.   I, Sallee Provencal, FNP, have reviewed all documentation for this visit. The documentation on 09/29/23 for the exam, diagnosis, procedures, and orders are all accurate and complete.   Sallee Provencal, FNP

## 2023-12-27 ENCOUNTER — Ambulatory Visit: Payer: 59 | Admitting: Family Medicine

## 2024-01-13 ENCOUNTER — Other Ambulatory Visit: Payer: Self-pay | Admitting: Family Medicine

## 2024-01-13 DIAGNOSIS — E039 Hypothyroidism, unspecified: Secondary | ICD-10-CM

## 2024-03-27 ENCOUNTER — Other Ambulatory Visit: Payer: Self-pay | Admitting: Family Medicine

## 2024-03-27 DIAGNOSIS — I10 Essential (primary) hypertension: Secondary | ICD-10-CM

## 2024-04-18 ENCOUNTER — Other Ambulatory Visit: Payer: Self-pay | Admitting: Family Medicine

## 2024-04-18 DIAGNOSIS — E039 Hypothyroidism, unspecified: Secondary | ICD-10-CM

## 2024-05-04 ENCOUNTER — Telehealth: Payer: Self-pay

## 2024-05-04 NOTE — Telephone Encounter (Signed)
 Copied from CRM #[id number]. Topic: Appointments - Transfer of Care >> May 04, 2024  8:30 AM [name] wrote: Pt is requesting to transfer FROM: Laura Wilkinson Pt is requesting to transfer TO: Laura Wilkinson Reason for requested transfer: provider leaving practice It is the responsibility of the team the patient would like to transfer to (Dr. Rockie Wilkinson) to reach out to the patient if for any reason this transfer is not acceptable. Patient declined to schedule an appointment at this time.

## 2024-05-04 NOTE — Telephone Encounter (Signed)
 I will accept transfer of care. Please schedule patient for transfer of care visit as available

## 2024-06-27 ENCOUNTER — Other Ambulatory Visit: Payer: Self-pay

## 2024-06-27 ENCOUNTER — Telehealth: Payer: Self-pay | Admitting: Family Medicine

## 2024-06-27 DIAGNOSIS — I10 Essential (primary) hypertension: Secondary | ICD-10-CM

## 2024-06-27 MED ORDER — LOSARTAN POTASSIUM-HCTZ 100-12.5 MG PO TABS: 1.0000 | ORAL_TABLET | Freq: Every day | ORAL | 0 refills | Status: AC

## 2024-06-27 NOTE — Telephone Encounter (Signed)
 [facility] faxed refill request for the following medications:   losartan -hydrochlorothiazide (HYZAAR) 100-12.5 MG tablet    Please advise.

## 2024-06-27 NOTE — Telephone Encounter (Signed)
 Prescription sent

## 2024-08-15 ENCOUNTER — Encounter: Admitting: Family Medicine

## 2024-09-12 ENCOUNTER — Encounter: Admitting: Family Medicine
